# Patient Record
Sex: Male | Born: 2000 | Race: Black or African American | Hispanic: No | Marital: Single | Smoking: Never smoker
Health system: Southern US, Community
[De-identification: ages and names within clinical notes are randomized; demographics above are authoritative.]

## PROBLEM LIST (undated history)

## (undated) HISTORY — PX: KIDNEY SURGERY: SHX687

---
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
## 2022-01-12 ENCOUNTER — Ambulatory Visit: Payer: Commercial Managed Care - PPO | Attending: Family | Admitting: Physical Therapy

## 2022-01-12 ENCOUNTER — Encounter: Payer: Self-pay | Admitting: Physical Therapy

## 2022-01-12 DIAGNOSIS — S86811A Strain of other muscle(s) and tendon(s) at lower leg level, right leg, initial encounter: Secondary | ICD-10-CM | POA: Diagnosis not present

## 2022-01-12 DIAGNOSIS — M25561 Pain in right knee: Secondary | ICD-10-CM

## 2022-01-12 NOTE — Therapy (Signed)
?OUTPATIENT PHYSICAL THERAPY LOWER EXTREMITY EVALUATION ? ? ?Patient Name: Bradley Lane ?MRN: [id number] ?DOB:2001-07-29, 21 y.o., male ?Today's Date: 01/13/2022 ? ? PT End of Session - 01/12/22 1027   ? ? Visit Number 1   ? Number of Visits 12   ? Date for PT Re-Evaluation 03/09/22   ? Authorization Type UHC/ UMR, MCD secondary   ? PT Start Time 1022   ? PT Stop Time 1104   ? PT Time Calculation (min) 42 min   ? Activity Tolerance Patient tolerated treatment well   ? Behavior During Therapy [facility] for tasks assessed/performed   ? ?  ?  ? ?  ? ? ?History reviewed. No pertinent past medical history. ?Past Surgical History:  ?Procedure Laterality Date  ? KIDNEY SURGERY    ? thinks there was a blockage  ? ?Patient Active Problem List  ? Diagnosis Date Noted  ? Rupture of right patellar tendon 12/28/2021  ? Preventative health care 12/03/2021  ? Acute pain of right knee 12/03/2021  ? ? ?PCP: Eden Emms, NP ? ?REFERRING PROVIDER: Mort Sawyers, FNP ? ?REFERRING DIAG: Y86.578I (ICD-10-CM) - Rupture of right patellar tendon, initial encounter  ? ?THERAPY DIAG:  ?Rt knee pain  ? ?ONSET DATE: 11/2021 ? ?SUBJECTIVE:  ? ?SUBJECTIVE STATEMENT: ?Patient was playing basketball last month.  Collided with another player. Knee was swollen for a bit.  Saw MD in 2 weeks.  He has difficulty playing basketball but continues to do so.  MD recommended to wear a sleeve but he admits he does not. He has pain with running, up stairs and if he sits too long the pain increases.  He often has to kick his leg out to the side in class when there is no leg room. He denies weakness or sensory issues. Symptoms are not getting better, feels the same.  ? ?PERTINENT HISTORY: ?Right knee: states that he sprained it twice in the past year. Recently sprained it an additional 2 times.  ?PAIN:  ?Are you having pain?Pain 0/10 on eval, with movement  Yes: NPRS scale: 4/10 ?Pain location: Rt knee inferior to patella  ?Pain description: stinging ?Aggravating  factors: sitting, stairs, running  ?Relieving factors: stretching it out  ? ?PRECAUTIONS: Other: none  ? ?WEIGHT BEARING RESTRICTIONS No ? ?FALLS:  ?Has patient fallen in last 6 months? No ? ?LIVING ENVIRONMENT: ?Lives with: lives with their family ?Lives in: House/apartment ?Stairs: Yes: External: 3 steps; none ?Has following equipment at home: None ? ?OCCUPATION: Consulting civil engineer, Engineering in [place] ? ?PLOF: Independent ? ?PATIENT GOALS Pt would like to get rid of most of the pain.  ? ? ?OBJECTIVE:  ? ?DIAGNOSTIC FINDINGS:  ?MRI ?Small, partial width, high-grade insertional tear involving the ?lateral aspect of the patellar tendon at the proximal attachment on ?the patella. Low-grade tendinosis or interstitial tearing medially. ?  ?No evidence of meniscus tear. Intact cruciate and collateral ?ligaments. ?  ?Low-grade chondral fissuring along the medial patellar facet. ?  ? ?PATIENT SURVEYS:  ?LEFS T ?  FOTO score 56% ?COGNITION: ? Overall cognitive status: Within functional limits for tasks assessed   ?  ?SENSATION: ?WFL ? ?MUSCLE LENGTH: ?Hamstrings: Right 40 deg; Left 50 deg ?Thomas test: Right tight deg; Left tight deg ? ?POSTURE:  ?Stands , sits with hips ER and abducted.  Genu varus , slight  ? ?PALPATION: ?Min tenderness just inferior to patella  ? ?LE ROM: ? ?Active ROM Right ?01/13/2022 Left ?01/13/2022  ?Hip flexion    ?  Hip extension    ?Hip abduction    ?Hip adduction    ?Hip internal rotation    ?Hip external rotation    ?Knee flexion 135 140  ?Knee extension 4 0  ?Ankle dorsiflexion    ?Ankle plantarflexion    ?Ankle inversion    ?Ankle eversion    ? (Blank rows = not tested) ?8 degree quad lag  ?LE MMT: ? ?MMT Right ?01/13/2022 Left ?01/13/2022  ?Hip flexion 5 5  ?Hip extension 4 5  ?Hip abduction 4- 4  ?Hip adduction    ?Hip internal rotation    ?Hip external rotation    ?Knee flexion 5 5  ?Knee extension 5 5  ?Ankle dorsiflexion    ?Ankle plantarflexion    ?Ankle inversion    ?Ankle eversion    ?  (Blank rows = not tested) ? ? ?FUNCTIONAL TESTS:  ?5 times sit to stand: WNL  ?Step ups WNL ?Step downs painful , shaky  ?Squats min pain with correction for hips and LE alignment  ? ?GAIT: ?Distance walked: 150 ?Assistive device utilized: None ?Level of assistance: Complete Independence ?Comments: WFL no limp , good stride  ? ? ? ?TODAY'S TREATMENT: ?PT eval and HEP  ? ? ?PATIENT EDUCATION:  ?Education details: HEP, POC ,anatomy  ?Person educated: Patient ?Education method: Explanation, Demonstration, Verbal cues, and Handouts ?Education comprehension: verbalized understanding, returned demonstration, and needs further education ? ? ?HOME EXERCISE PROGRAM: ?Access Code: [email or web address] ?URL: [email or web address] ?Date: 01/12/2022 ?Prepared by: Karie MainlandJennifer Gilman Olazabal ? ?Exercises ?- Straight Leg Raise with External Rotation  - 2 x daily - 7 x weekly - 2 sets - 10 reps - 5 hold ?- Prone Quadriceps Stretch with Strap  - 1 x daily - 7 x weekly - 1 sets - 3 reps - 30 hold ?- Modified Thomas Stretch  - 1 x daily - 7 x weekly - 1 sets - 3 reps - 30 hold ? ?ASSESSMENT: ? ?CLINICAL IMPRESSION: ?Patient is a 21  y.o. male  who was seen today for physical therapy evaluation and treatment for Rt patellar injury, small tear , see above imaging. Asked him to wear sleeve for basketball as a reminder to him he should protect his knee.   ? ? ?OBJECTIVE IMPAIRMENTS decreased mobility, difficulty walking, decreased strength, impaired flexibility, and pain.  ? ?ACTIVITY LIMITATIONS community activity and recreation .  ? ?PERSONAL FACTORS Behavior pattern and 1 comorbidity: previous injuries to L kne  are also affecting patient's functional outcome.  ? ? ?REHAB POTENTIAL: Excellent ? ?CLINICAL DECISION MAKING: Stable/uncomplicated ? ?EVALUATION COMPLEXITY: Low ? ? ?GOALS: ?Goals reviewed with patient? Yes ? ?LONG TERM GOALS: Target date: 02/24/2022 ? ?Pt will be I with HEP for hips, Rt knee strength, stability  ?Baseline: unknown ?Goal  status: INITIAL ? ?2.  Pt will increase hip strength to 5/5 for maximal knee stability with basketball   ?Baseline: 4 to 4-/5 ?Goal status: INITIAL ? ?3.  Pt will be able to regularly go up and down stairs without knee pain  ?Baseline: pain most of the time, esp. ascending ?Goal status: INITIAL ? ?4.  Pt will be able to run, perform light agility activities without increased knee pain  ?Baseline: pain, 4/10-5/10 with these  ?Goal status: INITIAL ? ?5.  Pt will improve FOTO score to 80% or better  ?Baseline: 56% ?Goal status: INITIAL ? ?PLAN: ?PT FREQUENCY: 2x/week ? ?PT DURATION: 6 weeks ? ?PLANNED INTERVENTIONS: Therapeutic exercises, Therapeutic activity, Neuromuscular re-education, Balance training, Gait training, Patient/Family  education, Joint mobilization, Cryotherapy, Taping, Ionotophoresis 4mg /ml Dexamethasone, and Manual therapy ? ?PLAN FOR NEXT SESSION: check HEP, add on for hip strength (abd, lateral band walk, squat with band, lat step ups) ? ? ?Seaborn Nakama, PT ?01/13/2022, 7:54 AM  ?01/15/2022, PT ?01/13/22 7:54 AM ?Phone: [phone] ?Fax: [phone]  ?

## 2022-01-15 ENCOUNTER — Encounter: Payer: Self-pay | Admitting: Physical Therapy

## 2022-01-15 ENCOUNTER — Ambulatory Visit: Payer: Commercial Managed Care - PPO | Admitting: Physical Therapy

## 2022-01-15 DIAGNOSIS — M25561 Pain in right knee: Secondary | ICD-10-CM | POA: Diagnosis not present

## 2022-01-15 NOTE — Therapy (Signed)
?OUTPATIENT PHYSICAL THERAPY TREATMENT NOTE ? ? ?Patient Name: Bradley Lane ?MRN: [id number] ?DOB:2000-11-05, 21 y.o., male ?Today's Date: 01/15/2022 ? ? ?END OF SESSION:  ? PT End of Session - 01/15/22 1194   ? ? Visit Number 2   ? Number of Visits 12   ? Date for PT Re-Evaluation 03/09/22   ? Authorization Type UHC/ UMR, MCD secondary   ? PT Start Time 0715   ? PT Stop Time 0753   ? PT Time Calculation (min) 38 min   ? ?  ?  ? ?  ? ? ?History reviewed. No pertinent past medical history. ?Past Surgical History:  ?Procedure Laterality Date  ? KIDNEY SURGERY    ? thinks there was a blockage  ? ?Patient Active Problem List  ? Diagnosis Date Noted  ? Rupture of right patellar tendon 12/28/2021  ? Preventative health care 12/03/2021  ? Acute pain of right knee 12/03/2021  ?PCP: Eden Emms, NP ?  ?REFERRING PROVIDER: Mort Sawyers, FNP ?  ?REFERRING DIAG: R74.081K (ICD-10-CM) - Rupture of right patellar tendon, initial encounter  ? ?THERAPY DIAG:  ?Acute pain of right knee ? ?PERTINENT HISTORY: Right knee: states that he sprained it twice in the past year. Recently sprained it an additional 2 times.   ? ?PRECAUTIONS: Other: none   ? ?SUBJECTIVE: The knee is the same. No pain on arrival.  ? ?PAIN:  ?Are you having pain?Pain 0/10 on eval, with movement  Yes: NPRS scale: 4/10 ?Pain location: Rt knee inferior to patella  ?Pain description: stinging ?Aggravating factors: sitting, stairs, running  ?Relieving factors: stretching it out  ? ? ?OBJECTIVE: (objective measures completed at initial evaluation unless otherwise dated) ? ? ?OBJECTIVE:  ?  ?DIAGNOSTIC FINDINGS:  ?MRI ?Small, partial width, high-grade insertional tear involving the ?lateral aspect of the patellar tendon at the proximal attachment on ?the patella. Low-grade tendinosis or interstitial tearing medially. ?  ?No evidence of meniscus tear. Intact cruciate and collateral ?ligaments. ?  ?Low-grade chondral fissuring along the medial patellar facet. ?  ?   ?PATIENT SURVEYS:  ?LEFS T ?            FOTO score 56% ?COGNITION: ?          Overall cognitive status: Within functional limits for tasks assessed               ?           ?SENSATION: ?WFL ?  ?MUSCLE LENGTH: ?Hamstrings: Right 40 deg; Left 50 deg ?Thomas test: Right tight deg; Left tight deg ?  ?POSTURE:  ?Stands , sits with hips ER and abducted.  Genu varus , slight  ?  ?PALPATION: ?Min tenderness just inferior to patella  ?  ?LE ROM: ?  ?Active ROM Right ?01/13/2022 Left ?01/13/2022  ?Hip flexion      ?Hip extension      ?Hip abduction      ?Hip adduction      ?Hip internal rotation      ?Hip external rotation      ?Knee flexion 135 140  ?Knee extension 4 0  ?Ankle dorsiflexion      ?Ankle plantarflexion      ?Ankle inversion      ?Ankle eversion      ? (Blank rows = not tested) ?8 degree quad lag  ?LE MMT: ?  ?MMT Right ?01/13/2022 Left ?01/13/2022  ?Hip flexion 5 5  ?Hip extension 4 5  ?Hip abduction 4- 4  ?  Hip adduction      ?Hip internal rotation      ?Hip external rotation      ?Knee flexion 5 5  ?Knee extension 5 5  ?Ankle dorsiflexion      ?Ankle plantarflexion      ?Ankle inversion      ?Ankle eversion      ? (Blank rows = not tested) ?  ?  ?FUNCTIONAL TESTS:  ?5 times sit to stand: WNL  ?Step ups WNL ?Step downs painful , shaky  ?Squats min pain with correction for hips and LE alignment  ?  ?GAIT: ?Distance walked: 150 ?Assistive device utilized: None ?Level of assistance: Complete Independence ?Comments: WFL no limp , good stride  ?  ?  ?  ?TODAY'S TREATMENT: ?[facility] Adult PT Treatment:                                                DATE: 01/15/22 ?Therapeutic Exercise: ?Rec bike L2 x 5 min ?Hip flexor stretch 3 x 30 ?Prone quad stretch 3 x 30 sec ?SLR with ER 10 x 2  ?Side hip abduction 10 x 2  ?Sit-stand with blue band 10 x 1, squat to chair tap  10 x 2  ?Lateral band walks with blue band 10 ft x 4  ?4 inch lateral step ups x 10- cues for LE joint alignment -increased pain - decreased motor control   ? ? ?INITIAL TREATMENT: ?PT eval and HEP  ?  ?  ?PATIENT EDUCATION:  ?Education details: HEP, POC ,anatomy  ?Person educated: Patient ?Education method: Explanation, Demonstration, Verbal cues, and Handouts ?Education comprehension: verbalized understanding, returned demonstration, and needs further education ?  ?  ?HOME EXERCISE PROGRAM: ?Access Code: [email or web address] ?URL: [email or web address] ?Date: 01/15/2022 ?Prepared by: Jannette Spanner ? ?Exercises ?- Straight Leg Raise with External Rotation  - 2 x daily - 7 x weekly - 2 sets - 10 reps - 5 hold ?- Prone Quadriceps Stretch with Strap  - 1 x daily - 7 x weekly - 1 sets - 3 reps - 30 hold ?- Modified Thomas Stretch  - 1 x daily - 7 x weekly - 1 sets - 3 reps - 30 hold ?- Sidelying Hip Abduction  - 1 x daily - 7 x weekly - 2 sets - 10 reps ?- Squat with Chair Touch and Resistance Loop  - 1 x daily - 7 x weekly - 3 sets - 10 reps ?- Side Stepping with Resistance at Thighs  - 1 x daily - 7 x weekly - 4 sets - 10 reps ?  ?ASSESSMENT: ?  ?CLINICAL IMPRESSION: ?Patient is a 21  y.o. male  who was seen today for physical therapy treatment for Rt patellar injury, small tear , see above imaging. He reports no change since evaluation and has been compliant with initial HEP. Today, time spent with review of initial HEP and progression per PT POC with updated HEP.  He was able to complete banded squats and side steps without increased pain. Lateral step ups were painful.  ?  ?OBJECTIVE IMPAIRMENTS decreased mobility, difficulty walking, decreased strength, impaired flexibility, and pain.  ?  ?ACTIVITY LIMITATIONS community activity and recreation .  ?  ?PERSONAL FACTORS Behavior pattern and 1 comorbidity: previous injuries to L kne  are also affecting patient's functional outcome.  ?  ?  ?REHAB POTENTIAL:  Excellent ?  ?CLINICAL DECISION MAKING: Stable/uncomplicated ?  ?EVALUATION COMPLEXITY: Low ?  ?  ?GOALS: ?Goals reviewed with patient? Yes ?  ?LONG TERM GOALS:  Target date: 02/24/2022 ?  ?Pt will be I with HEP for hips, Rt knee strength, stability  ?Baseline: unknown ?Goal status: INITIAL ?  ?2.  Pt will increase hip strength to 5/5 for maximal knee stability with basketball   ?Baseline: 4 to 4-/5 ?Goal status: INITIAL ?  ?3.  Pt will be able to regularly go up and down stairs without knee pain  ?Baseline: pain most of the time, esp. ascending ?Goal status: INITIAL ?  ?4.  Pt will be able to run, perform light agility activities without increased knee pain  ?Baseline: pain, 4/10-5/10 with these  ?Goal status: INITIAL ?  ?5.  Pt will improve FOTO score to 80% or better  ?Baseline: 56% ?Goal status: INITIAL ?  ?PLAN: ?PT FREQUENCY: 2x/week ?  ?PT DURATION: 6 weeks ?  ?PLANNED INTERVENTIONS: Therapeutic exercises, Therapeutic activity, Neuromuscular re-education, Balance training, Gait training, Patient/Family education, Joint mobilization, Cryotherapy, Taping, Ionotophoresis 4mg /ml Dexamethasone, and Manual therapy ?  ?PLAN FOR NEXT SESSION: check HEP, add on for hip strength - lat step ups when able , consider ionto next visit.  ? ?Check all possible CPT codes: [id number]- Therapeutic Exercise, [id number]- Neuro Re-education, [phone] - Gait Training, [phone] - Manual Therapy, 97530 - Therapeutic Activities, [phone] - Self Care, and [phone] - Iontophoresis    ? ?If treatment provided at initial evaluation, no treatment charged due to lack of authorization.   ? ? ? ? ?Jannette SpannerJessica Arris Meyn, PTA ?01/15/22 8:14 AM ?Phone: [phone] ?Fax: [phone]  ? ?   ?

## 2022-01-19 ENCOUNTER — Ambulatory Visit: Payer: Commercial Managed Care - PPO | Attending: Family

## 2022-01-19 DIAGNOSIS — M25561 Pain in right knee: Secondary | ICD-10-CM | POA: Diagnosis present

## 2022-01-19 NOTE — Therapy (Signed)
?OUTPATIENT PHYSICAL THERAPY TREATMENT NOTE ? ? ?Patient Name: Bradley Lane ?MRN: [id number] ?DOB:01/27/2001, 21 y.o., male ?Today's Date: 01/19/2022 ? ? ?END OF SESSION:  ? PT End of Session - 01/19/22 2150   ? ? Visit Number 3   ? Number of Visits 12   ? Date for PT Re-Evaluation 03/09/22   ? Authorization Type UHC/ UMR, MCD secondary   ? PT Start Time [phone]   ? PT Stop Time 0800   ? PT Time Calculation (min) 42 min   ? Activity Tolerance Patient tolerated treatment well   ? [facility] for tasks assessed/performed   ? ?  ?  ? ?  ? ? ? ?History reviewed. No pertinent past medical history. ?Past Surgical History:  ?Procedure Laterality Date  ? KIDNEY SURGERY    ? thinks there was a blockage  ? ?Patient Active Problem List  ? Diagnosis Date Noted  ? Rupture of right patellar tendon 12/28/2021  ? Preventative health care 12/03/2021  ? Acute pain of right knee 12/03/2021  ?PCP: Eden Emmsable, James M, NP ?  ?REFERRING PROVIDER: Mort Sawyersugal, Tabitha, FNP ?  ?REFERRING DIAG: [id number] (ICD-10-CM) - Rupture of right patellar tendon, initial encounter  ? ?THERAPY DIAG:  ?Acute pain of right knee ? ?PERTINENT HISTORY: Right knee: states that he sprained it twice in the past year. Recently sprained it an additional 2 times.   ? ?PRECAUTIONS: Other: none   ? ?SUBJECTIVE: Pt reports no change with his R knee pain. Pt notes he has been completing his HEP. ? ?PAIN:  ?Are you having pain?Pain 0/10 on eval, with movement  Yes: NPRS scale: 4/10 ?Pain location: Rt knee inferior to patella  ?Pain description: stinging ?Aggravating factors: sitting, stairs, running  ?Relieving factors: stretching it out  ? ? ?OBJECTIVE: (objective measures completed at initial evaluation unless otherwise dated) ? ? ?OBJECTIVE:  ?  ?DIAGNOSTIC FINDINGS:  ?MRI ?Small, partial width, high-grade insertional tear involving the ?lateral aspect of the patellar tendon at the proximal attachment on ?the patella. Low-grade tendinosis or interstitial tearing  medially. ?  ?No evidence of meniscus tear. Intact cruciate and collateral ?ligaments. ?  ?Low-grade chondral fissuring along the medial patellar facet. ?  ?  ?PATIENT SURVEYS:  ?LEFS T ?            FOTO score 56% ?COGNITION: ?          Overall cognitive status: Within functional limits for tasks assessed               ?           ?SENSATION: ?WFL ?  ?MUSCLE LENGTH: ?Hamstrings: Right 40 deg; Left 50 deg ?Thomas test: Right tight deg; Left tight deg ?  ?POSTURE:  ?Stands , sits with hips ER and abducted.  Genu varus , slight  ?  ?PALPATION: ?Min tenderness just inferior to patella  ?  ?LE ROM: ?  ?Active ROM Right ?01/13/2022 Left ?01/13/2022  ?Hip flexion      ?Hip extension      ?Hip abduction      ?Hip adduction      ?Hip internal rotation      ?Hip external rotation      ?Knee flexion 135 140  ?Knee extension 4 0  ?Ankle dorsiflexion      ?Ankle plantarflexion      ?Ankle inversion      ?Ankle eversion      ? (Blank rows = not tested) ?8 degree quad  lag  ?LE MMT: ?  ?MMT Right ?01/13/2022 Left ?01/13/2022  ?Hip flexion 5 5  ?Hip extension 4 5  ?Hip abduction 4- 4  ?Hip adduction      ?Hip internal rotation      ?Hip external rotation      ?Knee flexion 5 5  ?Knee extension 5 5  ?Ankle dorsiflexion      ?Ankle plantarflexion      ?Ankle inversion      ?Ankle eversion      ? (Blank rows = not tested) ?  ?  ?FUNCTIONAL TESTS:  ?5 times sit to stand: WNL  ?Step ups WNL ?Step downs painful , shaky  ?Squats min pain with correction for hips and LE alignment  ?  ?GAIT: ?Distance walked: 150 ?Assistive device utilized: None ?Level of assistance: Complete Independence ?Comments: WFL no limp , good stride  ?  ?  ?  ?TODAY'S TREATMENT: ?[facility] Adult PT Treatment:                                                DATE: 01/19/22 ?Therapeutic Exercise: ?Rec bike L2 x 5 min ?Hip flexor stretch 1 x 60 ?Prone quad stretch 1 x 60 sec ?SLR with ER 10 x 3, 4#  ?Side hip abduction 10 x 3, 4#  ?LAQ reclined trunk 10 x2 4#, min pain ?Sit-stand  with blue band 10 x 1, squat to chair tap  10 x 2, min pain  ?Lateral band walks with blue band 10 ft x 6 ?Lateral step ups c airex 2 x 10- cues for LE joint alignment ?Manual Therapy: ?CFM to the patellar tendon 3 min ?Self Care: ?CFM at home 2 to 3 x a day  ? ?[facility] Adult PT Treatment:                                                DATE: 01/15/22 ?Therapeutic Exercise: ?Rec bike L2 x 5 min ?Hip flexor stretch 3 x 30 ?Prone quad stretch 3 x 30 sec ?SLR with ER 10 x 2  ?Side hip abduction 10 x 2  ?Sit-stand with blue band 10 x 1, squat to chair tap  10 x 2  ?Lateral band walks with blue band 10 ft x 4  ?4 inch lateral step ups x 10- cues for LE joint alignment -increased pain - decreased motor control   ?  ?PATIENT EDUCATION:  ?Education details: HEP, POC ,anatomy  ?Person educated: Patient ?Education method: Explanation, Demonstration, Verbal cues, and Handouts ?Education comprehension: verbalized understanding, returned demonstration, and needs further education ?  ?  ?HOME EXERCISE PROGRAM: ?Access Code: [email or web address] ?URL: [email or web address] ?Date: 01/15/2022 ?Prepared by: Jannette Spanner ? ?Exercises ?- Straight Leg Raise with External Rotation  - 2 x daily - 7 x weekly - 2 sets - 10 reps - 5 hold ?- Prone Quadriceps Stretch with Strap  - 1 x daily - 7 x weekly - 1 sets - 3 reps - 30 hold ?- Modified Thomas Stretch  - 1 x daily - 7 x weekly - 1 sets - 3 reps - 30 hold ?- Sidelying Hip Abduction  - 1 x daily - 7 x weekly - 2 sets - 10 reps ?- Squat  with Chair Touch and Resistance Loop  - 1 x daily - 7 x weekly - 3 sets - 10 reps ?- Side Stepping with Resistance at Thighs  - 1 x daily - 7 x weekly - 4 sets - 10 reps ?  ?ASSESSMENT: ?  ?CLINICAL IMPRESSION: ?Pt was instructed in CFM for tissue remodeling. PT was competed for R LE strengthening in open and closed kinetic chain. Pt preorted experiencing min R knee pain with a few of the thererex, but n overall increase. Pt tolerated a gradual increase in  the physical demand with therex.  ?  ?OBJECTIVE IMPAIRMENTS decreased mobility, difficulty walking, decreased strength, impaired flexibility, and pain.  ?  ?ACTIVITY LIMITATIONS community activity and recreation .  ?  ?PERSONAL FACTORS Behavior pattern and 1 comorbidity: previous injuries to L kne  are also affecting patient's functional outcome.  ?  ?  ?REHAB POTENTIAL: Excellent ?  ?CLINICAL DECISION MAKING: Stable/uncomplicated ?  ?EVALUATION COMPLEXITY: Low ?  ?  ?GOALS: ?Goals reviewed with patient? Yes ?  ?LONG TERM GOALS: Target date: 02/24/2022 ?  ?Pt will be I with HEP for hips, Rt knee strength, stability  ?Baseline: unknown ?Goal status: INITIAL ?  ?2.  Pt will increase hip strength to 5/5 for maximal knee stability with basketball   ?Baseline: 4 to 4-/5 ?Goal status: INITIAL ?  ?3.  Pt will be able to regularly go up and down stairs without knee pain  ?Baseline: pain most of the time, esp. ascending ?Goal status: INITIAL ?  ?4.  Pt will be able to run, perform light agility activities without increased knee pain  ?Baseline: pain, 4/10-5/10 with these  ?Goal status: INITIAL ?  ?5.  Pt will improve FOTO score to 80% or better  ?Baseline: 56% ?Goal status: INITIAL ?  ?PLAN: ?PT FREQUENCY: 2x/week ?  ?PT DURATION: 6 weeks ?  ?PLANNED INTERVENTIONS: Therapeutic exercises, Therapeutic activity, Neuromuscular re-education, Balance training, Gait training, Patient/Family education, Joint mobilization, Cryotherapy, Taping, Ionotophoresis 4mg /ml Dexamethasone, and Manual therapy ?  ?PLAN FOR NEXT SESSION: check HEP, add on for hip strength - lat step ups when able , consider ionto next visit.  ? ?Check all possible CPT codes: - Therapeutic Exercise, [phone]- Neuro Re-education, [phone] - Gait Training, [phone] - Manual Therapy, 97530 - Therapeutic Activities, [phone] - Self Care, and [phone] - Iontophoresis    ? ?If treatment provided at initial evaluation, no treatment charged due to lack of authorization.   ? ?[id number] MS, PT ?01/19/22 10:02 PM ? ? ?   ?

## 2022-01-22 ENCOUNTER — Encounter: Payer: Self-pay | Admitting: Physical Therapy

## 2022-01-22 ENCOUNTER — Ambulatory Visit: Payer: Commercial Managed Care - PPO | Admitting: Physical Therapy

## 2022-01-22 DIAGNOSIS — M25561 Pain in right knee: Secondary | ICD-10-CM

## 2022-01-22 NOTE — Therapy (Signed)
?OUTPATIENT PHYSICAL THERAPY TREATMENT NOTE ? ? ?Patient Name: Bradley Lane ?MRN: [id number] ?DOB:[birth year], 21 y.o., male ?Today's Date: 01/22/2022 ? ? ?END OF SESSION:  ? PT End of Session - 01/22/22 [id number]   ? ? Visit Number 4   ? Number of Visits 12   ? Date for PT Re-Evaluation 03/09/22   ? Authorization Type UHC/ UMR, MCD secondary   ? PT Start Time 0715   ? PT Stop Time 0753   ? PT Time Calculation (min) 38 min   ? ?  ?  ? ?  ? ? ? ?History reviewed. No pertinent past medical history. ?Past Surgical History:  ?Procedure Laterality Date  ? KIDNEY SURGERY    ? thinks there was a blockage  ? ?Patient Active Problem List  ? Diagnosis Date Noted  ? Rupture of right patellar tendon 12/28/2021  ? Preventative health care 12/03/2021  ? Acute pain of right knee 12/03/2021  ?PCP: Michela Pitcher, NP ?  ?REFERRING PROVIDER: Eugenia Pancoast, FNP ?  ?REFERRING DIAG: [id number] (ICD-10-CM) - Rupture of right patellar tendon, initial encounter  ? ?THERAPY DIAG: Acute pain of right knee ? ? ?PERTINENT HISTORY: Right knee: states that he sprained it twice in the past year. Recently sprained it an additional 2 times.   ? ?PRECAUTIONS: Other: none   ? ?SUBJECTIVE: Pt reports knee pain is better.  Pt notes he has been completing his HEP. ? ?PAIN:  ?Are you having pain?Pain 0/10 on eval, with movement  Yes: NPRS scale: 4/10 ?Pain location: Rt knee inferior to patella  ?Pain description: stinging ?Aggravating factors: sitting, stairs, running  ?Relieving factors: stretching it out  ? ? ?OBJECTIVE: (objective measures completed at initial evaluation unless otherwise dated) ? ? ?OBJECTIVE:  ?  ?DIAGNOSTIC FINDINGS:  ?MRI ?Small, partial width, high-grade insertional tear involving the ?lateral aspect of the patellar tendon at the proximal attachment on ?the patella. Low-grade tendinosis or interstitial tearing medially. ?  ?No evidence of meniscus tear. Intact cruciate and collateral ?ligaments. ?  ?Low-grade chondral fissuring along the  medial patellar facet. ?  ?  ?PATIENT SURVEYS:  ?LEFS T ?            FOTO score 56% ?COGNITION: ?          Overall cognitive status: Within functional limits for tasks assessed               ?           ?SENSATION: ?WFL ?  ?MUSCLE LENGTH: ?Hamstrings: Right 40 deg; Left 50 deg ?Thomas test: Right tight deg; Left tight deg ?  ?POSTURE:  ?Stands , sits with hips ER and abducted.  Genu varus , slight  ?  ?PALPATION: ?Min tenderness just inferior to patella  ?  ?LE ROM: ?  ?Active ROM Right ?01/13/2022 Left ?01/13/2022  ?Hip flexion      ?Hip extension      ?Hip abduction      ?Hip adduction      ?Hip internal rotation      ?Hip external rotation      ?Knee flexion 135 140  ?Knee extension 4 0  ?Ankle dorsiflexion      ?Ankle plantarflexion      ?Ankle inversion      ?Ankle eversion      ? (Blank rows = not tested) ?8 degree quad lag  ?LE MMT: ?  ?MMT Right ?01/13/2022 Left ?01/13/2022  ?Hip flexion 5 5  ?Hip extension 4 5  ?  Hip abduction 4- 4  ?Hip adduction      ?Hip internal rotation      ?Hip external rotation      ?Knee flexion 5 5  ?Knee extension 5 5  ?Ankle dorsiflexion      ?Ankle plantarflexion      ?Ankle inversion      ?Ankle eversion      ? (Blank rows = not tested) ?  ?  ?FUNCTIONAL TESTS:  ?5 times sit to stand: WNL  ?Step ups WNL ?Step downs painful , shaky  ?Squats min pain with correction for hips and LE alignment  ?  ?GAIT: ?Distance walked: 150 ?Assistive device utilized: None ?Level of assistance: Complete Independence ?Comments: WFL no limp , good stride  ?  ?  ?  ?TODAY'S TREATMENT: ?[facility] Adult PT Treatment:                                                DATE: 01/22/22 ?Therapeutic Exercise: ?Nustep L5 LE only x 5 min ?4 inch lateral step down 10 x 2  ?6 inch runners step up 10 x 2  ?blue band  squat to chair tap  10 x 2, no pain ?Lateral band walks with blue band 10 ft x 6 ?Side hip abduction 10 x 3, 4#  ?SLR with ER 10 x 3, 4#  ?SAQ 4# 10 x 3 ?LAQ 4# 10 x 3  ?Hip flexor stretch 2 x 60 ?Prone quad  stretch 2 x 60 ?Hamstring stretch with strap 3 x 30 ? ? ? ?[facility] Adult PT Treatment:                                                DATE: 01/19/22 ?Therapeutic Exercise: ?Rec bike L2 x 5 min ?Hip flexor stretch 1 x 60 ?Prone quad stretch 1 x 60 sec ?SLR with ER 10 x 3, 4#  ?Side hip abduction 10 x 3, 4#  ?LAQ reclined trunk 10 x2 4#, min pain ?Sit-stand with blue band 10 x 1, squat to chair tap  10 x 2, min pain  ?Lateral band walks with blue band 10 ft x 6 ?Lateral step ups c airex 2 x 10- cues for LE joint alignment ?Manual Therapy: ?CFM to the patellar tendon 3 min ?Self Care: ?CFM at home 2 to 3 x a day  ? ?[facility] Adult PT Treatment:                                                DATE: 01/15/22 ?Therapeutic Exercise: ?Rec bike L2 x 5 min ?Hip flexor stretch 3 x 30 ?Prone quad stretch 3 x 30 sec ?SLR with ER 10 x 2  ?Side hip abduction 10 x 2  ?Sit-stand with blue band 10 x 1, squat to chair tap  10 x 2  ?Lateral band walks with blue band 10 ft x 4  ?4 inch lateral step ups x 10- cues for LE joint alignment -increased pain - decreased motor control   ?  ?PATIENT EDUCATION:  ?Education details: HEP, POC ,anatomy  ?Person educated: Patient ?  Education method: Explanation, Demonstration, Verbal cues, and Handouts ?Education comprehension: verbalized understanding, returned demonstration, and needs further education ?  ?  ?HOME EXERCISE PROGRAM: ?Access Code: [email or web address] ?URL: [email or web address] ?Date: 01/15/2022 ?Prepared by: Hessie Diener ? ?Exercises ?- Straight Leg Raise with External Rotation  - 2 x daily - 7 x weekly - 2 sets - 10 reps - 5 hold ?- Prone Quadriceps Stretch with Strap  - 1 x daily - 7 x weekly - 1 sets - 3 reps - 30 hold ?- Modified Thomas Stretch  - 1 x daily - 7 x weekly - 1 sets - 3 reps - 30 hold ?- Sidelying Hip Abduction  - 1 x daily - 7 x weekly - 2 sets - 10 reps ?- Squat with Chair Touch and Resistance Loop  - 1 x daily - 7 x weekly - 3 sets - 10 reps ?- Side Stepping with  Resistance at Thighs  - 1 x daily - 7 x weekly - 4 sets - 10 reps ?  ?ASSESSMENT: ?  ?CLINICAL IMPRESSION: ? PT was competed for R LE strengthening in open and closed kinetic chain. Pt reported min pain with lateral step ups otherwise, no pain.   Pt tolerated a gradual increase in the physical demand with therex. Overall pt is reporting a decrease in right knee pain and less pain with therex.  ?  ?OBJECTIVE IMPAIRMENTS decreased mobility, difficulty walking, decreased strength, impaired flexibility, and pain.  ?  ?ACTIVITY LIMITATIONS community activity and recreation .  ?  ?PERSONAL FACTORS Behavior pattern and 1 comorbidity: previous injuries to L kne  are also affecting patient's functional outcome.  ?  ?  ?REHAB POTENTIAL: Excellent ?  ?CLINICAL DECISION MAKING: Stable/uncomplicated ?  ?EVALUATION COMPLEXITY: Low ?  ?  ?GOALS: ?Goals reviewed with patient? Yes ?  ?LONG TERM GOALS: Target date: 02/24/2022 ?  ?Pt will be I with HEP for hips, Rt knee strength, stability  ?Baseline: unknown ?Goal status: INITIAL ?  ?2.  Pt will increase hip strength to 5/5 for maximal knee stability with basketball   ?Baseline: 4 to 4-/5 ?Goal status: INITIAL ?  ?3.  Pt will be able to regularly go up and down stairs without knee pain  ?Baseline: pain most of the time, esp. ascending ?Goal status: INITIAL ?  ?4.  Pt will be able to run, perform light agility activities without increased knee pain  ?Baseline: pain, 4/10-5/10 with these  ?Goal status: INITIAL ?  ?5.  Pt will improve FOTO score to 80% or better  ?Baseline: 56% ?Goal status: INITIAL ?  ?PLAN: ?PT FREQUENCY: 2x/week ?  ?PT DURATION: 6 weeks ?  ?PLANNED INTERVENTIONS: Therapeutic exercises, Therapeutic activity, Neuromuscular re-education, Balance training, Gait training, Patient/Family education, Joint mobilization, Cryotherapy, Taping, Ionotophoresis 4mg /ml Dexamethasone, and Manual therapy ?  ?PLAN FOR NEXT SESSION: check HEP, add on for hip strength - lat step ups when  able , consider ionto next visit.  ? ?Check all possible CPT codes: 97110- Therapeutic Exercise, [phone]- Neuro Re-education, [phone] - Gait Training, [phone] - Manual Therapy, 97530 - Therapeutic Activities, [phone] -

## 2022-01-26 ENCOUNTER — Ambulatory Visit: Payer: Commercial Managed Care - PPO | Admitting: Physical Therapy

## 2022-01-26 ENCOUNTER — Encounter: Payer: Self-pay | Admitting: Physical Therapy

## 2022-01-26 DIAGNOSIS — M25561 Pain in right knee: Secondary | ICD-10-CM

## 2022-01-26 NOTE — Therapy (Signed)
?OUTPATIENT PHYSICAL THERAPY TREATMENT NOTE ? ? ?Patient Name: Bradley Lane ?MRN: [id number] ?DOB:06/13/2001, 21 y.o., male ?Today's Date: 01/26/2022 ? ? ?END OF SESSION:  ? PT End of Session - 01/26/22 0717   ? ? Visit Number 5   ? Number of Visits 12   ? Date for PT Re-Evaluation 03/09/22   ? Authorization Type UHC/ UMR, MCD secondary   ? PT Start Time 0715   ? PT Stop Time 0756   ? PT Time Calculation (min) 41 min   ? ?  ?  ? ?  ? ? ? ?History reviewed. No pertinent past medical history. ?Past Surgical History:  ?Procedure Laterality Date  ? KIDNEY SURGERY    ? thinks there was a blockage  ? ?Patient Active Problem List  ? Diagnosis Date Noted  ? Rupture of right patellar tendon 12/28/2021  ? Preventative health care 12/03/2021  ? Acute pain of right knee 12/03/2021  ?PCP: Eden Emms, NP ?  ?REFERRING PROVIDER: Mort Sawyers, FNP ?  ?REFERRING DIAG: N82.956O (ICD-10-CM) - Rupture of right patellar tendon, initial encounter  ? ?THERAPY DIAG: Acute pain of right knee ? ? ?PERTINENT HISTORY: Right knee: states that he sprained it twice in the past year. Recently sprained it an additional 2 times.   ? ?PRECAUTIONS: Other: none   ? ?SUBJECTIVE: Pt reports knee pain is better.  Pt notes he has been completing his HEP. ? ?PAIN:  ?Are you having pain?Pain 0/10 on eval, with movement  Yes: NPRS scale: 4/10 ?Pain location: Rt knee inferior to patella  ?Pain description: stinging ?Aggravating factors: sitting, stairs, running  ?Relieving factors: stretching it out  ? ? ?OBJECTIVE: (objective measures completed at initial evaluation unless otherwise dated) ? ? ?OBJECTIVE:  ?  ?DIAGNOSTIC FINDINGS:  ?MRI ?Small, partial width, high-grade insertional tear involving the ?lateral aspect of the patellar tendon at the proximal attachment on ?the patella. Low-grade tendinosis or interstitial tearing medially. ?  ?No evidence of meniscus tear. Intact cruciate and collateral ?ligaments. ?  ?Low-grade chondral fissuring along the  medial patellar facet. ?  ?  ?PATIENT SURVEYS:  ?LEFS T ?            FOTO score 56% ?COGNITION: ?          Overall cognitive status: Within functional limits for tasks assessed               ?           ?SENSATION: ?WFL ?  ?MUSCLE LENGTH: ?Hamstrings: Right 40 deg; Left 50 deg ?Thomas test: Right tight deg; Left tight deg ?  ?POSTURE:  ?Stands , sits with hips ER and abducted.  Genu varus , slight  ?  ?PALPATION: ?Min tenderness just inferior to patella  ?  ?LE ROM: ?  ?Active ROM Right ?01/13/2022 Left ?01/13/2022 Right  ?01/26/22  ?Hip flexion       ?Hip extension       ?Hip abduction       ?Hip adduction       ?Hip internal rotation       ?Hip external rotation       ?Knee flexion 135 140 140  ?Knee extension 4 0 0  ?Ankle dorsiflexion       ?Ankle plantarflexion       ?Ankle inversion       ?Ankle eversion       ? (Blank rows = not tested) ?8 degree quad lag  ?LE MMT: ?  ?  MMT Right ?01/13/2022 Left ?01/13/2022 Right ?01/26/22  ?Hip flexion 5 5   ?Hip extension 4 5   ?Hip abduction 4- 4 5/5  ?Hip adduction       ?Hip internal rotation       ?Hip external rotation       ?Knee flexion 5 5   ?Knee extension 5 5   ?Ankle dorsiflexion       ?Ankle plantarflexion       ?Ankle inversion       ?Ankle eversion       ? (Blank rows = not tested) ?  ?  ?FUNCTIONAL TESTS:  ?5 times sit to stand: WNL  ?Step ups WNL ?Step downs painful , shaky  ?Squats min pain with correction for hips and LE alignment  ?  ?GAIT: ?Distance walked: 150 ?Assistive device utilized: None ?Level of assistance: Complete Independence ?Comments: WFL no limp , good stride  ?  ?  ?  ?TODAY'S TREATMENT: ?[facility] Adult PT Treatment:                                                DATE: 01/26/22 ?Therapeutic Exercise: ?Nustep L5 LE only x 5 min ?Rebounder toss SLS red ball , added foam oval 20 toss without LOB ?Cone drill from SLS 10 x 2 touches without LOB ?4 inch lateral step down 10 x 2  ?8 inch runners step up 10 x 2  ?blue band  squat to chair tap  10 x 2 ?Lateral  band walks with blue band 10 ft x 6 ?Wall Sit 5 sec x10 x2 ?Side hip abduction 10 x 3, 5#  ?SLR with ER 10 x 3, 5#  ?Single leg bridge R 10 x 2 each ? ?Hip flexor stretch 2 x 60 ?Prone quad stretch 2 x 60 ?Hamstring stretch with strap 3 x 30 ? ? ?[facility] Adult PT Treatment:                                                DATE: 01/22/22 ?Therapeutic Exercise: ?Nustep L5 LE only x 5 min ?4 inch lateral step down 10 x 2  ?6 inch runners step up 10 x 2  ?blue band  squat to chair tap  10 x 2, no pain ?Lateral band walks with blue band 10 ft x 6 ?Side hip abduction 10 x 3, 4#  ?SLR with ER 10 x 3, 4#  ?SAQ 4# 10 x 3 ?LAQ 4# 10 x 3  ?Hip flexor stretch 2 x 60 ?Prone quad stretch 2 x 60 ?Hamstring stretch with strap 3 x 30 ? ? ? ?[facility] Adult PT Treatment:                                                DATE: 01/19/22 ?Therapeutic Exercise: ?Rec bike L2 x 5 min ?Hip flexor stretch 1 x 60 ?Prone quad stretch 1 x 60 sec ?SLR with ER 10 x 3, 4#  ?Side hip abduction 10 x 3, 4#  ?LAQ reclined trunk 10 x2 4#, min pain ?Sit-stand with blue band 10 x  1, squat to chair tap  10 x 2, min pain  ?Lateral band walks with blue band 10 ft x 6 ?Lateral step ups c airex 2 x 10- cues for LE joint alignment ?Manual Therapy: ?CFM to the patellar tendon 3 min ?Self Care: ?CFM at home 2 to 3 x a day  ? ?[facility] Adult PT Treatment:                                                DATE: 01/15/22 ?Therapeutic Exercise: ?Rec bike L2 x 5 min ?Hip flexor stretch 3 x 30 ?Prone quad stretch 3 x 30 sec ?SLR with ER 10 x 2  ?Side hip abduction 10 x 2  ?Sit-stand with blue band 10 x 1, squat to chair tap  10 x 2  ?Lateral band walks with blue band 10 ft x 4  ?4 inch lateral step ups x 10- cues for LE joint alignment -increased pain - decreased motor control   ?  ?PATIENT EDUCATION:  ?Education details: HEP, POC ,anatomy  ?Person educated: Patient ?Education method: Explanation, Demonstration, Verbal cues, and Handouts ?Education comprehension: verbalized understanding,  returned demonstration, and needs further education ?  ?  ?HOME EXERCISE PROGRAM: ?Access Code: [email or web address] ?URL: [email or web address] ?Date: 01/15/2022 ?Prepared by: Jannette Spanner ? ?Exercises ?- Straight Leg Raise with External Rotation  - 2 x daily - 7 x weekly - 2 sets - 10 reps - 5 hold ?- Prone Quadriceps Stretch with Strap  - 1 x daily - 7 x weekly - 1 sets - 3 reps - 30 hold ?- Modified Thomas Stretch  - 1 x daily - 7 x weekly - 1 sets - 3 reps - 30 hold ?- Sidelying Hip Abduction  - 1 x daily - 7 x weekly - 2 sets - 10 reps ?- Squat with Chair Touch and Resistance Loop  - 1 x daily - 7 x weekly - 3 sets - 10 reps ?- Side Stepping with Resistance at Thighs  - 1 x daily - 7 x weekly - 4 sets - 10 reps ?  ?ASSESSMENT: ?  ?CLINICAL IMPRESSION: ? PT was competed for R LE strengthening in open and closed kinetic chain.Increased Single leg challenges today without increased pain. No pain with step downs today. He reports no pain  with ADLs since his last visit.   His MMT and AROM for Right knee and hip have improved. He is progressing toward LTGs.   ?  ?OBJECTIVE IMPAIRMENTS decreased mobility, difficulty walking, decreased strength, impaired flexibility, and pain.  ?  ?ACTIVITY LIMITATIONS community activity and recreation .  ?  ?PERSONAL FACTORS Behavior pattern and 1 comorbidity: previous injuries to L kne  are also affecting patient's functional outcome.  ?  ?  ?REHAB POTENTIAL: Excellent ?  ?CLINICAL DECISION MAKING: Stable/uncomplicated ?  ?EVALUATION COMPLEXITY: Low ?  ?  ?GOALS: ?Goals reviewed with patient? Yes ?  ?LONG TERM GOALS: Target date: 02/24/2022 ?  ?Pt will be I with HEP for hips, Rt knee strength, stability  ?Baseline: unknown ?Goal status: INITIAL ?  ?2.  Pt will increase hip strength to 5/5 for maximal knee stability with basketball   ?Baseline: 4 to 4-/5 ?Goal status: INITIAL ?  ?3.  Pt will be able to regularly go up and down stairs without knee pain  ?Baseline: pain most of the  time,  esp. ascending ?Goal status: INITIAL ?  ?4.  Pt will be able to run, perform light agility activities without increased knee pain  ?Baseline: pain, 4/10-5/10 with these  ?Goal status: INITIAL ?  ?5.

## 2022-01-29 ENCOUNTER — Encounter: Payer: Self-pay | Admitting: Physical Therapy

## 2022-01-29 ENCOUNTER — Ambulatory Visit: Payer: Commercial Managed Care - PPO | Admitting: Physical Therapy

## 2022-01-29 DIAGNOSIS — M25561 Pain in right knee: Secondary | ICD-10-CM | POA: Diagnosis not present

## 2022-01-29 NOTE — Therapy (Signed)
?OUTPATIENT PHYSICAL THERAPY TREATMENT NOTE ? ? ?Patient Name: Bradley Lane ?MRN: [id number] ?DOB:12-Jun-2001, 21 y.o., male ?Today's Date: 01/29/2022 ? ? ?END OF SESSION:  ? PT End of Session - 01/29/22 0715   ? ? Visit Number 6   ? Number of Visits 12   ? Date for PT Re-Evaluation 03/09/22   ? Authorization Type UHC/ UMR, MCD secondary   ? PT Start Time 0715   ? PT Stop Time 0755   ? PT Time Calculation (min) 40 min   ? ?  ?  ? ?  ? ? ? ?History reviewed. No pertinent past medical history. ?Past Surgical History:  ?Procedure Laterality Date  ? KIDNEY SURGERY    ? thinks there was a blockage  ? ?Patient Active Problem List  ? Diagnosis Date Noted  ? Rupture of right patellar tendon 12/28/2021  ? Preventative health care 12/03/2021  ? Acute pain of right knee 12/03/2021  ?PCP: Eden Emms, NP ?  ?REFERRING PROVIDER: Mort Sawyers, FNP ?  ?REFERRING DIAG: N82.956O (ICD-10-CM) - Rupture of right patellar tendon, initial encounter  ? ?THERAPY DIAG: Acute pain of right knee ? ? ?PERTINENT HISTORY: Right knee: states that he sprained it twice in the past year. Recently sprained it an additional 2 times.   ? ?PRECAUTIONS: Other: none   ? ?SUBJECTIVE: Pt reports knee pain is better.  Pt notes he has been completing his HEP. ? ?PAIN:  ?Are you having pain?Pain 0/10 on eval, with movement  Yes: NPRS scale: 0/10 ?Pain location: Rt knee inferior to patella  ?Pain description: stinging ?Aggravating factors: sitting, stairs, running  ?Relieving factors: stretching it out  ? ? ?OBJECTIVE: (objective measures completed at initial evaluation unless otherwise dated) ? ? ?OBJECTIVE:  ?  ?DIAGNOSTIC FINDINGS:  ?MRI ?Small, partial width, high-grade insertional tear involving the ?lateral aspect of the patellar tendon at the proximal attachment on ?the patella. Low-grade tendinosis or interstitial tearing medially. ?  ?No evidence of meniscus tear. Intact cruciate and collateral ?ligaments. ?  ?Low-grade chondral fissuring along the  medial patellar facet. ?  ?  ?PATIENT SURVEYS:  ?LEFS T ?            FOTO score 56% (prediction 80) ? FOTO score 72% ?COGNITION: ?          Overall cognitive status: Within functional limits for tasks assessed               ?           ?SENSATION: ?WFL ?  ?MUSCLE LENGTH: ?Hamstrings: Right 40 deg; Left 50 deg ?Thomas test: Right tight deg; Left tight deg ?  ?POSTURE:  ?Stands , sits with hips ER and abducted.  Genu varus , slight  ?  ?PALPATION: ?Min tenderness just inferior to patella  ?  ?LE ROM: ?  ?Active ROM Right ?01/13/2022 Left ?01/13/2022 Right  ?01/26/22  ?Hip flexion       ?Hip extension       ?Hip abduction       ?Hip adduction       ?Hip internal rotation       ?Hip external rotation       ?Knee flexion 135 140 140  ?Knee extension 4 0 0  ?Ankle dorsiflexion       ?Ankle plantarflexion       ?Ankle inversion       ?Ankle eversion       ? (Blank rows = not tested) ?8 degree quad  lag  ?LE MMT: ?  ?MMT Right ?01/13/2022 Left ?01/13/2022 Right ?01/26/22  ?Hip flexion 5 5   ?Hip extension 4 5   ?Hip abduction 4- 4 5/5  ?Hip adduction       ?Hip internal rotation       ?Hip external rotation       ?Knee flexion 5 5   ?Knee extension 5 5   ?Ankle dorsiflexion       ?Ankle plantarflexion       ?Ankle inversion       ?Ankle eversion       ? (Blank rows = not tested) ?  ?  ?FUNCTIONAL TESTS:  ?5 times sit to stand: WNL  ?Step ups WNL ?Step downs painful , shaky  ?Squats min pain with correction for hips and LE alignment  ?  ?GAIT: ?Distance walked: 150 ?Assistive device utilized: None ?Level of assistance: Complete Independence ?Comments: WFL no limp , good stride  ?  ?  ?  ?TODAY'S TREATMENT: ?[facility] Adult PT Treatment:                                                DATE: 01/28/22 ?Therapeutic Exercise: ?Elliptical Ramp 5 Resistance 3  x 5 min ?Rebounder toss SLS red ball ,  Airex 15 toss x2  ?Cone drill from SLS 10 x 2 - added airex for challenge today ?Wall Sit 5 sec x10 x2- 12# ?BOSU step up 10 x 2  ?Lunge with left  knee tap to BOSU x 10 ?4 inch forward step down with retro step up x 20 ?blue band  squat to chair tap x20 15# KB ?Lateral band walks with blue band 20 ft x 4 ? ?Side hip abduction 15 x 2, 5#  ?SLR with ER 15 x 2 , 5#  ?Single leg bridge R 15 x 2   10#  ? ?Hip flexor stretch 2 x 60 ?Prone quad stretch 2 x 60 ?Hamstring stretch with strap 3 x 30 ? ?[facility] Adult PT Treatment:                                                DATE: 01/26/22 ?Therapeutic Exercise: ?Nustep L5 LE only x 5 min ?Rebounder toss SLS red ball , added foam oval 20 toss without LOB ?Cone drill from SLS 10 x 2 touches without LOB ?4 inch lateral step down 10 x 2  ?8 inch runners step up 10 x 2  ?blue band  squat to chair tap  10 x 2 ?Lateral band walks with blue band 10 ft x 6 ?Wall Sit 5 sec x10 x2 ?Side hip abduction 10 x 3, 5#  ?SLR with ER 10 x 3, 5#  ?Single leg bridge R 10 x 2 each ? ?Hip flexor stretch 2 x 60 ?Prone quad stretch 2 x 60 ?Hamstring stretch with strap 3 x 30 ? ? ?[facility] Adult PT Treatment:                                                DATE: 01/22/22 ?Therapeutic Exercise: ?Nustep L5  LE only x 5 min ?4 inch lateral step down 10 x 2  ?6 inch runners step up 10 x 2  ?blue band  squat to chair tap  10 x 2, no pain ?Lateral band walks with blue band 10 ft x 6 ?Side hip abduction 10 x 3, 4#  ?SLR with ER 10 x 3, 4#  ?SAQ 4# 10 x 3 ?LAQ 4# 10 x 3  ?Hip flexor stretch 2 x 60 ?Prone quad stretch 2 x 60 ?Hamstring stretch with strap 3 x 30 ? ? ? ?[facility] Adult PT Treatment:                                                DATE: 01/19/22 ?Therapeutic Exercise: ?Rec bike L2 x 5 min ?Hip flexor stretch 1 x 60 ?Prone quad stretch 1 x 60 sec ?SLR with ER 10 x 3, 4#  ?Side hip abduction 10 x 3, 4#  ?LAQ reclined trunk 10 x2 4#, min pain ?Sit-stand with blue band 10 x 1, squat to chair tap  10 x 2, min pain  ?Lateral band walks with blue band 10 ft x 6 ?Lateral step ups c airex 2 x 10- cues for LE joint alignment ?Manual Therapy: ?CFM to the patellar tendon 3  min ?Self Care: ?CFM at home 2 to 3 x a day  ? ?[facility] Adult PT Treatment:                                                DATE: 01/15/22 ?Therapeutic Exercise: ?Rec bike L2 x 5 min ?Hip flexor stretch 3 x 30 ?Prone quad stretch 3 x 30 sec ?SLR with ER 10 x 2  ?Side hip abduction 10 x 2  ?Sit-stand with blue band 10 x 1, squat to chair tap  10 x 2  ?Lateral band walks with blue band 10 ft x 4  ?4 inch lateral step ups x 10- cues for LE joint alignment -increased pain - decreased motor control   ?  ?PATIENT EDUCATION:  ?Education details: HEP, POC ,anatomy  ?Person educated: Patient ?Education method: Explanation, Demonstration, Verbal cues, and Handouts ?Education comprehension: verbalized understanding, returned demonstration, and needs further education ?  ?  ?HOME EXERCISE PROGRAM: ?Access Code: [email or web address] ?URL: [email or web address] ?Date: 01/15/2022 ?Prepared by: Jannette Spanner ? ?Exercises ?- Straight Leg Raise with External Rotation  - 2 x daily - 7 x weekly - 2 sets - 10 reps - 5 hold ?- Prone Quadriceps Stretch with Strap  - 1 x daily - 7 x weekly - 1 sets - 3 reps - 30 hold ?- Modified Thomas Stretch  - 1 x daily - 7 x weekly - 1 sets - 3 reps - 30 hold ?- Sidelying Hip Abduction  - 1 x daily - 7 x weekly - 2 sets - 10 reps ?- Squat with Chair Touch and Resistance Loop  - 1 x daily - 7 x weekly - 3 sets - 10 reps ?- Side Stepping with Resistance at Thighs  - 1 x daily - 7 x weekly - 4 sets - 10 reps ?  ?ASSESSMENT: ?  ?CLINICAL IMPRESSION: ? PT was competed for R LE strengthening in open and closed kinetic chain.  Increased Single leg challenges on unlevel surface today without increased pain. Began agility with controlled hop from right to left and he did not have pain. He continues to report overall improvement with no pain on stairs or with sitting.   He is progressing toward LTGs. FOTO score improved.  He was asked to wear sneakers next visit.   ?  ?OBJECTIVE IMPAIRMENTS decreased mobility,  difficulty walking, decreased strength, impaired flexibility, and pain.  ?  ?ACTIVITY LIMITATIONS community activity and recreation .  ?  ?PERSONAL FACTORS Behavior pattern and 1 comorbidity: previous injur

## 2022-02-02 ENCOUNTER — Encounter: Payer: Self-pay | Admitting: Physical Therapy

## 2022-02-02 ENCOUNTER — Ambulatory Visit: Payer: Commercial Managed Care - PPO | Admitting: Physical Therapy

## 2022-02-02 DIAGNOSIS — M25561 Pain in right knee: Secondary | ICD-10-CM

## 2022-02-02 NOTE — Therapy (Signed)
?OUTPATIENT PHYSICAL THERAPY TREATMENT NOTE ? ? ?Patient Name: Bradley Lane ?MRN: [id number] ?DOB:[birth year], 21 y.o., male ?Today's Date: 02/02/2022 ? ? ?END OF SESSION:  ? PT End of Session - 02/02/22 0725   ? ? Visit Number 7   ? Number of Visits 12   ? Date for PT Re-Evaluation 03/09/22   ? Authorization Type UHC/ UMR, MCD secondary   ? PT Start Time 0715   ? PT Stop Time 0800   ? PT Time Calculation (min) 45 min   ? ?  ?  ? ?  ? ? ? ?History reviewed. No pertinent past medical history. ?Past Surgical History:  ?Procedure Laterality Date  ? KIDNEY SURGERY    ? thinks there was a blockage  ? ?Patient Active Problem List  ? Diagnosis Date Noted  ? Rupture of right patellar tendon 12/28/2021  ? Preventative health care 12/03/2021  ? Acute pain of right knee 12/03/2021  ?PCP: Michela Pitcher, NP ?  ?REFERRING PROVIDER: Eugenia Pancoast, FNP ?  ?REFERRING DIAG: [id number] (ICD-10-CM) - Rupture of right patellar tendon, initial encounter  ? ?THERAPY DIAG: Acute pain of right knee ? ? ?PERTINENT HISTORY: Right knee: states that he sprained it twice in the past year. Recently sprained it an additional 2 times.   ? ?PRECAUTIONS: Other: none   ? ?SUBJECTIVE: Pt reports knee pain is better.  Pt notes he has been completing his HEP. ? ?PAIN:  ?Are you having pain?Pain 0/10 on eval, with movement  Yes: NPRS scale: 0/10 ?Pain location: Rt knee inferior to patella  ?Pain description: stinging ?Aggravating factors: sitting, stairs, running  ?Relieving factors: stretching it out  ? ? ?OBJECTIVE: (objective measures completed at initial evaluation unless otherwise dated) ? ? ?OBJECTIVE:  ?  ?DIAGNOSTIC FINDINGS:  ?MRI ?Small, partial width, high-grade insertional tear involving the ?lateral aspect of the patellar tendon at the proximal attachment on ?the patella. Low-grade tendinosis or interstitial tearing medially. ?  ?No evidence of meniscus tear. Intact cruciate and collateral ?ligaments. ?  ?Low-grade chondral fissuring along the  medial patellar facet. ?  ?  ?PATIENT SURVEYS:  ?LEFS T ?            FOTO score 56% (prediction 80) ? FOTO score 72% ?COGNITION: ?          Overall cognitive status: Within functional limits for tasks assessed               ?           ?SENSATION: ?WFL ?  ?MUSCLE LENGTH: ?Hamstrings: Right 40 deg; Left 50 deg ?Thomas test: Right tight deg; Left tight deg ?  ?POSTURE:  ?Stands , sits with hips ER and abducted.  Genu varus , slight  ?  ?PALPATION: ?Min tenderness just inferior to patella  ?  ?LE ROM: ?  ?Active ROM Right ?01/13/2022 Left ?01/13/2022 Right  ?01/26/22  ?Hip flexion       ?Hip extension       ?Hip abduction       ?Hip adduction       ?Hip internal rotation       ?Hip external rotation       ?Knee flexion 135 140 140  ?Knee extension 4 0 0  ?Ankle dorsiflexion       ?Ankle plantarflexion       ?Ankle inversion       ?Ankle eversion       ? (Blank rows = not tested) ?8 degree quad  lag  ?LE MMT: ?  ?MMT Right ?01/13/2022 Left ?01/13/2022 Right ?01/26/22 Right ?02/02/22  ?Hip flexion 5 5    ?Hip extension 4 5  4+/5  ?Hip abduction 4- 4 5/5   ?Hip adduction        ?Hip internal rotation        ?Hip external rotation        ?Knee flexion 5 5    ?Knee extension 5 5    ?Ankle dorsiflexion        ?Ankle plantarflexion        ?Ankle inversion        ?Ankle eversion        ? (Blank rows = not tested) ?  ?  ?FUNCTIONAL TESTS:  ?5 times sit to stand: WNL  ?Step ups WNL ?Step downs painful , shaky  ?Squats min pain with correction for hips and LE alignment  ?  ?GAIT: ?Distance walked: 150 ?Assistive device utilized: None ?Level of assistance: Complete Independence ?Comments: WFL no limp , good stride  ?  ?  ?  ?TODAY'S TREATMENT: ?[facility] Adult PT Treatment:                                                DATE: 02/02/22 ?Therapeutic Exercise: ?Elliptical Ramp 5 Resistance 5  x 5 min ?Hands on wall alternating hops to each leg ?Side shuffling 1 minute ?High knees 1 minute ?Squat jump in place 5 reps x 3  ?Karaoke x 1 minute   ?Rebounder toss SLS yellow ball ,  Airex 15 toss x2  ?Cone drill reach from SLS 10 x 2 - airex pad  ?Wall Sit 5 sec x10 x2- 14# ?Lunge with left knee tap to BOSU x 10 each side  ?Dynamic lunge 50 feet x 2  ?6 inch forward step down with retro step up x 10 ?6 inch forward step down- heel strike only 10 x 2 ?Single leg sit-stand 10 x 2 no weight  ? ?Prone quad stretch 2 x 60 sec ?Hamstring stretch with strap 3 x 30 sec ? ?[facility] Adult PT Treatment:                                                DATE: 01/28/22 ?Therapeutic Exercise: ?Elliptical Ramp 5 Resistance 3  x 5 min ?Rebounder toss SLS red ball ,  Airex 15 toss x2  ?Cone drill from SLS 10 x 2 - added airex for challenge today ?Wall Sit 5 sec x10 x2- 12# ?BOSU step up 10 x 2  ?Lunge with left knee tap to BOSU x 10 ?4 inch forward step down with retro step up x 20 ?blue band  squat to chair tap x20 15# KB ?Lateral band walks with blue band 20 ft x 4 ?Hands on wall alternating hops to single leg  ?Side hip abduction 15 x 2, 5#  ?SLR with ER 15 x 2 , 5#  ?Single leg bridge R 15 x 2   10#  ? ?Hip flexor stretch 2 x 60 ?Prone quad stretch 2 x 60 ?Hamstring stretch with strap 3 x 30 ? ?[facility] Adult PT Treatment:  DATE: 01/26/22 ?Therapeutic Exercise: ?Nustep L5 LE only x 5 min ?Rebounder toss SLS red ball , added foam oval 20 toss without LOB ?Cone drill from SLS 10 x 2 touches without LOB ?4 inch lateral step down 10 x 2  ?8 inch runners step up 10 x 2  ?blue band  squat to chair tap  10 x 2 ?Lateral band walks with blue band 10 ft x 6 ?Wall Sit 5 sec x10 x2 ?Side hip abduction 10 x 3, 5#  ?SLR with ER 10 x 3, 5#  ?Single leg bridge R 10 x 2 each ? ?Hip flexor stretch 2 x 60 ?Prone quad stretch 2 x 60 ?Hamstring stretch with strap 3 x 30 ? ? ?[facility] Adult PT Treatment:                                                DATE: 01/22/22 ?Therapeutic Exercise: ?Nustep L5 LE only x 5 min ?4 inch lateral step down 10 x 2  ?6 inch runners step  up 10 x 2  ?blue band  squat to chair tap  10 x 2, no pain ?Lateral band walks with blue band 10 ft x 6 ?Side hip abduction 10 x 3, 4#  ?SLR with ER 10 x 3, 4#  ?SAQ 4# 10 x 3 ?LAQ 4# 10 x 3  ?Hip flexor stretch 2 x 60 ?Prone quad stretch 2 x 60 ?Hamstring stretch with strap 3 x 30 ? ? ? ?[facility] Adult PT Treatment:                                                DATE: 01/19/22 ?Therapeutic Exercise: ?Rec bike L2 x 5 min ?Hip flexor stretch 1 x 60 ?Prone quad stretch 1 x 60 sec ?SLR with ER 10 x 3, 4#  ?Side hip abduction 10 x 3, 4#  ?LAQ reclined trunk 10 x2 4#, min pain ?Sit-stand with blue band 10 x 1, squat to chair tap  10 x 2, min pain  ?Lateral band walks with blue band 10 ft x 6 ?Lateral step ups c airex 2 x 10- cues for LE joint alignment ?Manual Therapy: ?CFM to the patellar tendon 3 min ?Self Care: ?CFM at home 2 to 3 x a day  ? ?[facility] Adult PT Treatment:                                                DATE: 01/15/22 ?Therapeutic Exercise: ?Rec bike L2 x 5 min ?Hip flexor stretch 3 x 30 ?Prone quad stretch 3 x 30 sec ?SLR with ER 10 x 2  ?Side hip abduction 10 x 2  ?Sit-stand with blue band 10 x 1, squat to chair tap  10 x 2  ?Lateral band walks with blue band 10 ft x 4  ?4 inch lateral step ups x 10- cues for LE joint alignment -increased pain - decreased motor control   ?  ?PATIENT EDUCATION:  ?Education details: HEP, POC ,anatomy  ?Person educated: Patient ?Education method: Explanation, Demonstration, Verbal cues, and Handouts ?Education comprehension: verbalized understanding,  returned demonstration, and needs further education ?  ?  ?HOME EXERCISE PROGRAM: ?Access Code: [email or web address] ?URL: [email or web address] ?Date: 01/15/2022 ?Prepared by: Hessie Diener ? ?Exercises ?- Straight Leg Raise with External Rotation  - 2 x daily - 7 x weekly - 2 sets - 10 reps - 5 hold ?- Prone Quadriceps Stretch with Strap  - 1 x daily - 7 x weekly - 1 sets - 3 reps - 30 hold ?- Modified Thomas Stretch  - 1 x daily -  7 x weekly - 1 sets - 3 reps - 30 hold ?- Sidelying Hip Abduction  - 1 x daily - 7 x weekly - 2 sets - 10 reps ?- Squat with Chair Touch and Resistance Loop  - 1 x daily - 7 x weekly - 3 sets - 10 reps ?- S

## 2022-02-05 ENCOUNTER — Ambulatory Visit: Payer: Commercial Managed Care - PPO | Admitting: Physical Therapy

## 2022-02-05 ENCOUNTER — Encounter: Payer: Self-pay | Admitting: Physical Therapy

## 2022-02-05 DIAGNOSIS — M25561 Pain in right knee: Secondary | ICD-10-CM

## 2022-02-05 NOTE — Therapy (Signed)
OUTPATIENT PHYSICAL THERAPY TREATMENT NOTE   Patient Name: Bradley Lane MRN: [id number] DOB:[birth year], 21 y.o., male Today's Date: 02/05/2022   END OF SESSION:   PT End of Session - 02/05/22 0720     Visit Number 8    Number of Visits 12    Date for PT Re-Evaluation 03/09/22    Authorization Type UHC/ UMR, MCD secondary    PT Start Time 0717    PT Stop Time 0755    PT Time Calculation (min) 38 min              History reviewed. No pertinent past medical history. Past Surgical History:  Procedure Laterality Date   KIDNEY SURGERY     thinks there was a blockage   Patient Active Problem List   Diagnosis Date Noted   Rupture of right patellar tendon 12/28/2021   Preventative health care 12/03/2021   Acute pain of right knee 12/03/2021  PCP: Michela Pitcher, NP   REFERRING PROVIDER: Eugenia Pancoast, FNP   REFERRING DIAG: [phone] (ICD-10-CM) - Rupture of right patellar tendon, initial encounter   THERAPY DIAG: Acute pain of right knee   PERTINENT HISTORY: Right knee: states that he sprained it twice in the past year. Recently sprained it an additional 2 times.    PRECAUTIONS: Other: none    SUBJECTIVE: I was sore in my thighs and hamstrings after last session. Not too sore now. No knee pain.   PAIN:  Are you having pain?Pain 0/10 on eval, with movement  Yes: NPRS scale: 0/10 Pain location: Rt knee inferior to patella  Pain description: stinging Aggravating factors: sitting, stairs, running  Relieving factors: stretching it out    OBJECTIVE: (objective measures completed at initial evaluation unless otherwise dated)   OBJECTIVE:    DIAGNOSTIC FINDINGS:  MRI Small, partial width, high-grade insertional tear involving the lateral aspect of the patellar tendon at the proximal attachment on the patella. Low-grade tendinosis or interstitial tearing medially.   No evidence of meniscus tear. Intact cruciate and collateral ligaments.   Low-grade chondral  fissuring along the medial patellar facet.     PATIENT SURVEYS:  LEFS T             FOTO score 56% (prediction 80)  FOTO score 72%  COGNITION:           Overall cognitive status: Within functional limits for tasks assessed                          SENSATION: WFL   MUSCLE LENGTH: Hamstrings: Right 40 deg; Left 50 deg Thomas test: Right tight deg; Left tight deg   POSTURE:  Stands , sits with hips ER and abducted.  Genu varus , slight    PALPATION: Min tenderness just inferior to patella    LE ROM:   Active ROM Right 01/13/2022 Left 01/13/2022 Right  01/26/22  Hip flexion       Hip extension       Hip abduction       Hip adduction       Hip internal rotation       Hip external rotation       Knee flexion 135 140 140  Knee extension 4 0 0  Ankle dorsiflexion       Ankle plantarflexion       Ankle inversion       Ankle eversion        (Blank rows =  not tested) 8 degree quad lag  LE MMT:   MMT Right 01/13/2022 Left 01/13/2022 Right 01/26/22 Right 02/02/22  Hip flexion 5 5    Hip extension 4 5  4+/5  Hip abduction 4- 4 5/5   Hip adduction        Hip internal rotation        Hip external rotation        Knee flexion 5 5    Knee extension 5 5    Ankle dorsiflexion        Ankle plantarflexion        Ankle inversion        Ankle eversion         (Blank rows = not tested)     FUNCTIONAL TESTS:  5 times sit to stand: WNL  Step ups WNL Step downs painful , shaky  Squats min pain with correction for hips and LE alignment    GAIT: Distance walked: 150 Assistive device utilized: None Level of assistance: Complete Independence Comments: WFL no limp , good stride        TODAY'S TREATMENT: [facility] Adult PT Treatment:                                                DATE: 02/05/22 Therapeutic Exercise: Elliptical Ramp 5 Resistance 5  x 5 min Side shuffling 1 minute High knees 1 minute Karaoke x 1 minute  Squat jump in place 5 reps Long jump 5 reps x 4  Rebounder  toss SLS yellow ball on Airex 15 toss x2  Cone drill reach from SLS 10 x 2 - airex pad  Dynamic lunge 50 feet x 4 Wall Sit 5 sec x10 x2- 16#  8 inch step up x20  6 inch forward step down- heel strike only 10 x 2 Single leg sit-stand 10 x 2 no weight   Prone quad stretch 2 x 60 sec Hamstring stretch with strap 3 x 30 sec  [facility] Adult PT Treatment:                                                DATE: 02/02/22 Therapeutic Exercise: Elliptical Ramp 5 Resistance 5  x 5 min Hands on wall alternating hops to each leg Side shuffling 1 minute High knees 1 minute Squat jump in place 5 reps x 3  Karaoke x 1 minute  Rebounder toss SLS yellow ball ,  Airex 15 toss x2  Cone drill reach from SLS 10 x 2 - airex pad  Wall Sit 5 sec x10 x2- 14# Lunge with left knee tap to BOSU x 10 each side  Dynamic lunge 50 feet x 2  6 inch forward step down with retro step up x 10 6 inch forward step down- heel strike only 10 x 2 Single leg sit-stand 10 x 2 no weight   Prone quad stretch 2 x 60 sec Hamstring stretch with strap 3 x 30 sec  [facility] Adult PT Treatment:  DATE: 01/28/22 Therapeutic Exercise: Elliptical Ramp 5 Resistance 3  x 5 min Rebounder toss SLS red ball ,  Airex 15 toss x2  Cone drill from SLS 10 x 2 - added airex for challenge today Wall Sit 5 sec x10 x2- 12# BOSU step up 10 x 2  Lunge with left knee tap to BOSU x 10 4 inch forward step down with retro step up x 20 blue band  squat to chair tap x20 15# KB Lateral band walks with blue band 20 ft x 4 Hands on wall alternating hops to single leg  Side hip abduction 15 x 2, 5#  SLR with ER 15 x 2 , 5#  Single leg bridge R 15 x 2   10#   Hip flexor stretch 2 x 60 Prone quad stretch 2 x 60 Hamstring stretch with strap 3 x 30  [facility] Adult PT Treatment:                                                DATE: 01/26/22 Therapeutic Exercise: Nustep L5 LE only x 5 min Rebounder toss SLS red ball , added  foam oval 20 toss without LOB Cone drill from SLS 10 x 2 touches without LOB 4 inch lateral step down 10 x 2  8 inch runners step up 10 x 2  blue band  squat to chair tap  10 x 2 Lateral band walks with blue band 10 ft x 6 Wall Sit 5 sec x10 x2 Side hip abduction 10 x 3, 5#  SLR with ER 10 x 3, 5#  Single leg bridge R 10 x 2 each  Hip flexor stretch 2 x 60 Prone quad stretch 2 x 60 Hamstring stretch with strap 3 x 30   [facility] Adult PT Treatment:                                                DATE: 01/22/22 Therapeutic Exercise: Nustep L5 LE only x 5 min 4 inch lateral step down 10 x 2  6 inch runners step up 10 x 2  blue band  squat to chair tap  10 x 2, no pain Lateral band walks with blue band 10 ft x 6 Side hip abduction 10 x 3, 4#  SLR with ER 10 x 3, 4#  SAQ 4# 10 x 3 LAQ 4# 10 x 3  Hip flexor stretch 2 x 60 Prone quad stretch 2 x 60 Hamstring stretch with strap 3 x 30    [facility] Adult PT Treatment:                                                DATE: 01/19/22 Therapeutic Exercise: Rec bike L2 x 5 min Hip flexor stretch 1 x 60 Prone quad stretch 1 x 60 sec SLR with ER 10 x 3, 4#  Side hip abduction 10 x 3, 4#  LAQ reclined trunk 10 x2 4#, min pain Sit-stand with blue band 10 x 1, squat to chair tap  10 x 2, min pain  Lateral band walks with blue  band 10 ft x 6 Lateral step ups c airex 2 x 10- cues for LE joint alignment Manual Therapy: CFM to the patellar tendon 3 min Self Care: CFM at home 2 to 3 x a day   [facility] Adult PT Treatment:                                                DATE: 01/15/22 Therapeutic Exercise: Rec bike L2 x 5 min Hip flexor stretch 3 x 30 Prone quad stretch 3 x 30 sec SLR with ER 10 x 2  Side hip abduction 10 x 2  Sit-stand with blue band 10 x 1, squat to chair tap  10 x 2  Lateral band walks with blue band 10 ft x 4  4 inch lateral step ups x 10- cues for LE joint alignment -increased pain - decreased motor control     PATIENT EDUCATION:   Education details: HEP, POC ,anatomy  Person educated: Patient Education method: Explanation, Demonstration, Verbal cues, and Handouts Education comprehension: verbalized understanding, returned demonstration, and needs further education     HOME EXERCISE PROGRAM: Access Code: [email or web address] URL: [email or web address] Date: 01/15/2022 Prepared by: Hessie Diener  Exercises - Straight Leg Raise with External Rotation  - 2 x daily - 7 x weekly - 2 sets - 10 reps - 5 hold - Prone Quadriceps Stretch with Strap  - 1 x daily - 7 x weekly - 1 sets - 3 reps - 30 hold - Modified Thomas Stretch  - 1 x daily - 7 x weekly - 1 sets - 3 reps - 30 hold - Sidelying Hip Abduction  - 1 x daily - 7 x weekly - 2 sets - 10 reps - Squat with Chair Touch and Resistance Loop  - 1 x daily - 7 x weekly - 3 sets - 10 reps - Side Stepping with Resistance at Thighs  - 1 x daily - 7 x weekly - 4 sets - 10 reps   ASSESSMENT:   CLINICAL IMPRESSION:  PT was competed for R LE strengthening in closed kinetic chain. Progressed to long jump without pain and good form/ landing. Continued with plyometric, agility, and advanced strengthening challenges  without increased pain.   Hip  He has partially met most LTGS. Will continue to progress as able.    OBJECTIVE IMPAIRMENTS decreased mobility, difficulty walking, decreased strength, impaired flexibility, and pain.    ACTIVITY LIMITATIONS community activity and recreation .    PERSONAL FACTORS Behavior pattern and 1 comorbidity: previous injuries to L kne  are also affecting patient's functional outcome.      REHAB POTENTIAL: Excellent   CLINICAL DECISION MAKING: Stable/uncomplicated   EVALUATION COMPLEXITY: Low     GOALS: Goals reviewed with patient? Yes   LONG TERM GOALS: Target date: 02/24/2022   Pt will be I with HEP for hips, Rt knee strength, stability  Baseline: unknown Goal status: MET   2.  Pt will increase hip strength to 5/5 for maximal  knee stability with basketball   Baseline: 4 to 4-/5 Status: 02/02/22- 4+/5 to 5/5 Goal status: NEARLY MET   3.  Pt will be able to regularly go up and down stairs without knee pain  Baseline: pain most of the time, esp. Ascending Status: no pain lately on stairs 01/28/22 Goal status: PARTIALLY MET   4.  Pt  will be able to run, perform light agility activities without increased knee pain  Baseline: pain, 4/10-5/10 with these  Status: 02/02/22- no pain with agility in clinic Goal status: PARTIALLY MET   5.  Pt will improve FOTO score to 80% or better  Baseline: 56% Status 72% Goal status: ONGOING   PLAN: PT FREQUENCY: 2x/week   PT DURATION: 6 weeks   PLANNED INTERVENTIONS: Therapeutic exercises, Therapeutic activity, Neuromuscular re-education, Balance training, Gait training, Patient/Family education, Joint mobilization, Cryotherapy, Taping, Ionotophoresis 94m/ml Dexamethasone, and Manual therapy   PLAN FOR NEXT SESSION: assess response to last visit; consider beginner agility if pt wears sneakers.   Check all possible CPT codes: 97110- Therapeutic Exercise, [phone] Neuro Re-education, [phone]- Gait Training, [phone]- Manual Therapy, 97530 - Therapeutic Activities, [phone]- Self Care, and [phone]- Iontophoresis     If treatment provided at initial evaluation, no treatment charged due to lack of authorization.    JHessie Diener PTA 02/05/22 7:21 AM Phone: [phone]: [phone]

## 2022-02-09 ENCOUNTER — Encounter: Payer: Self-pay | Admitting: Physical Therapy

## 2022-02-09 ENCOUNTER — Ambulatory Visit: Payer: Commercial Managed Care - PPO | Admitting: Physical Therapy

## 2022-02-09 DIAGNOSIS — M25561 Pain in right knee: Secondary | ICD-10-CM | POA: Diagnosis not present

## 2022-02-09 NOTE — Therapy (Addendum)
OUTPATIENT PHYSICAL THERAPY TREATMENT NOTE DISCHARGE   Patient Name: Bradley Lane MRN: [id number] DOB:[birth year], 21 y.o., male Today's Date: 02/09/2022   END OF SESSION:   PT End of Session - 02/09/22 0719     Visit Number 9    Number of Visits 12    Date for PT Re-Evaluation 03/09/22    Authorization Type UHC/ UMR, MCD secondary    PT Start Time 0715    PT Stop Time 0748    PT Time Calculation (min) 33 min              History reviewed. No pertinent past medical history. Past Surgical History:  Procedure Laterality Date   KIDNEY SURGERY     thinks there was a blockage   Patient Active Problem List   Diagnosis Date Noted   Rupture of right patellar tendon 12/28/2021   Preventative health care 12/03/2021   Acute pain of right knee 12/03/2021  PCP: Michela Pitcher, NP   REFERRING PROVIDER: Eugenia Pancoast, FNP   REFERRING DIAG: [phone] (ICD-10-CM) - Rupture of right patellar tendon, initial encounter   THERAPY DIAG: Acute pain of right knee  Rationale for Evaluation and Treatment Rehabilitation    PERTINENT HISTORY: Right knee: states that he sprained it twice in the past year. Recently sprained it an additional 2 times.    PRECAUTIONS: Other: none    SUBJECTIVE: I was sore in my thighs and hamstrings after last session. Not too sore now. No knee pain.   PAIN:  Are you having pain?Pain 0/10 on eval, with movement  No: NPRS scale: 0/10    OBJECTIVE: (objective measures completed at initial evaluation unless otherwise dated)      DIAGNOSTIC FINDINGS:  MRI Small, partial width, high-grade insertional tear involving the lateral aspect of the patellar tendon at the proximal attachment on the patella. Low-grade tendinosis or interstitial tearing medially.   No evidence of meniscus tear. Intact cruciate and collateral ligaments.   Low-grade chondral fissuring along the medial patellar facet.     PATIENT SURVEYS:  LEFS T             FOTO score 56%  (prediction 80)  FOTO score 01/29/22:  72%  FOTO score 02/09/22: 99%  COGNITION:           Overall cognitive status: Within functional limits for tasks assessed                          SENSATION: WFL   MUSCLE LENGTH: Hamstrings: Right 40 deg; Left 50 deg Thomas test: Right tight deg; Left tight deg   POSTURE:  Stands , sits with hips ER and abducted.  Genu varus , slight    PALPATION: Min tenderness just inferior to patella    LE ROM:   Active ROM Right 01/13/2022 Left 01/13/2022 Right  01/26/22  Hip flexion       Hip extension       Hip abduction       Hip adduction       Hip internal rotation       Hip external rotation       Knee flexion 135 140 140  Knee extension 4 0 0  Ankle dorsiflexion       Ankle plantarflexion       Ankle inversion       Ankle eversion        (Blank rows = not tested) 8 degree quad lag  LE MMT:   MMT Right 01/13/2022 Left 01/13/2022 Right 01/26/22 Right 02/02/22 Right 02/09/22  Hip flexion 5 5     Hip extension 4 5  4+/5 5/5  Hip abduction 4- 4 5/5  5/5  Hip adduction         Hip internal rotation         Hip external rotation         Knee flexion 5 5     Knee extension 5 5     Ankle dorsiflexion         Ankle plantarflexion         Ankle inversion         Ankle eversion          (Blank rows = not tested)     FUNCTIONAL TESTS:  5 times sit to stand: WNL  Step ups WNL Step downs painful , shaky  Squats min pain with correction for hips and LE alignment    GAIT: Distance walked: 150 Assistive device utilized: None Level of assistance: Complete Independence Comments: WFL no limp , good stride        TODAY'S TREATMENT: [facility] Adult PT Treatment:                                                DATE: 02/09/22 Therapeutic Exercise: Elliptical Ramp 5 Resistance 5  x 5 min Side shuffling 1 minute High knees 1 minute Single leg hop 5 x 2 = equal distance R vs L Dynamic lunge 50 feet x 4 Long jump 5 reps x 4  Rebounder toss SLS  yellow ball on Airex 15 toss x2  Cone drill reach from SLS 10 x 2 - airex pad  8 inch step up x20 6 inch forward step down- heel strike only 10 x 2 FOTO 99% Prone quad stretch 2 x 60 sec Hamstring stretch with strap 3 x 30 sec  [facility] Adult PT Treatment:                                                DATE: 02/05/22 Therapeutic Exercise: Elliptical Ramp 5 Resistance 5  x 5 min Side shuffling 1 minute High knees 1 minute Karaoke x 1 minute  Squat jump in place 5 reps Long jump 5 reps x 4  Rebounder toss SLS yellow ball on Airex 15 toss x2  Cone drill reach from SLS 10 x 2 - airex pad  Dynamic lunge 50 feet x 4 Wall Sit 5 sec x10 x2- 16#  8 inch step up x20  6 inch forward step down- heel strike only 10 x 2 Single leg sit-stand 10 x 2 no weight   Prone quad stretch 2 x 60 sec Hamstring stretch with strap 3 x 30 sec  [facility] Adult PT Treatment:                                                DATE: 02/02/22 Therapeutic Exercise: Elliptical Ramp 5 Resistance 5  x 5 min Hands on wall alternating hops to each leg Side shuffling 1 minute  High knees 1 minute Squat jump in place 5 reps x 3  Karaoke x 1 minute  Rebounder toss SLS yellow ball ,  Airex 15 toss x2  Cone drill reach from SLS 10 x 2 - airex pad  Wall Sit 5 sec x10 x2- 14# Lunge with left knee tap to BOSU x 10 each side  Dynamic lunge 50 feet x 2  6 inch forward step down with retro step up x 10 6 inch forward step down- heel strike only 10 x 2 Single leg sit-stand 10 x 2 no weight   Prone quad stretch 2 x 60 sec Hamstring stretch with strap 3 x 30 sec  [facility] Adult PT Treatment:                                                DATE: 01/28/22 Therapeutic Exercise: Elliptical Ramp 5 Resistance 3  x 5 min Rebounder toss SLS red ball ,  Airex 15 toss x2  Cone drill from SLS 10 x 2 - added airex for challenge today Wall Sit 5 sec x10 x2- 12# BOSU step up 10 x 2  Lunge with left knee tap to BOSU x 10 4 inch forward step down  with retro step up x 20 blue band  squat to chair tap x20 15# KB Lateral band walks with blue band 20 ft x 4 Hands on wall alternating hops to single leg  Side hip abduction 15 x 2, 5#  SLR with ER 15 x 2 , 5#  Single leg bridge R 15 x 2   10#   Hip flexor stretch 2 x 60 Prone quad stretch 2 x 60 Hamstring stretch with strap 3 x 30  [facility] Adult PT Treatment:                                                DATE: 01/26/22 Therapeutic Exercise: Nustep L5 LE only x 5 min Rebounder toss SLS red ball , added foam oval 20 toss without LOB Cone drill from SLS 10 x 2 touches without LOB 4 inch lateral step down 10 x 2  8 inch runners step up 10 x 2  blue band  squat to chair tap  10 x 2 Lateral band walks with blue band 10 ft x 6 Wall Sit 5 sec x10 x2 Side hip abduction 10 x 3, 5#  SLR with ER 10 x 3, 5#  Single leg bridge R 10 x 2 each  Hip flexor stretch 2 x 60 Prone quad stretch 2 x 60 Hamstring stretch with strap 3 x 30   [facility] Adult PT Treatment:                                                DATE: 01/22/22 Therapeutic Exercise: Nustep L5 LE only x 5 min 4 inch lateral step down 10 x 2  6 inch runners step up 10 x 2  blue band  squat to chair tap  10 x 2, no pain Lateral band walks with blue band 10 ft x 6 Side hip abduction  10 x 3, 4#  SLR with ER 10 x 3, 4#  SAQ 4# 10 x 3 LAQ 4# 10 x 3  Hip flexor stretch 2 x 60 Prone quad stretch 2 x 60 Hamstring stretch with strap 3 x 30    [facility] Adult PT Treatment:                                                DATE: 01/19/22 Therapeutic Exercise: Rec bike L2 x 5 min Hip flexor stretch 1 x 60 Prone quad stretch 1 x 60 sec SLR with ER 10 x 3, 4#  Side hip abduction 10 x 3, 4#  LAQ reclined trunk 10 x2 4#, min pain Sit-stand with blue band 10 x 1, squat to chair tap  10 x 2, min pain  Lateral band walks with blue band 10 ft x 6 Lateral step ups c airex 2 x 10- cues for LE joint alignment Manual Therapy: CFM to the patellar tendon 3  min Self Care: CFM at home 2 to 3 x a day   [facility] Adult PT Treatment:                                                DATE: 01/15/22 Therapeutic Exercise: Rec bike L2 x 5 min Hip flexor stretch 3 x 30 Prone quad stretch 3 x 30 sec SLR with ER 10 x 2  Side hip abduction 10 x 2  Sit-stand with blue band 10 x 1, squat to chair tap  10 x 2  Lateral band walks with blue band 10 ft x 4  4 inch lateral step ups x 10- cues for LE joint alignment -increased pain - decreased motor control     PATIENT EDUCATION:  Education details: HEP, POC ,anatomy  Person educated: Patient Education method: Explanation, Demonstration, Verbal cues, and Handouts Education comprehension: verbalized understanding, returned demonstration, and needs further education     HOME EXERCISE PROGRAM: Access Code: [email or web address] URL: [email or web address] Date: 01/15/2022 Prepared by: Hessie Diener  Exercises - Straight Leg Raise with External Rotation  - 2 x daily - 7 x weekly - 2 sets - 10 reps - 5 hold - Prone Quadriceps Stretch with Strap  - 1 x daily - 7 x weekly - 1 sets - 3 reps - 30 hold - Modified Thomas Stretch  - 1 x daily - 7 x weekly - 1 sets - 3 reps - 30 hold - Sidelying Hip Abduction  - 1 x daily - 7 x weekly - 2 sets - 10 reps - Squat with Chair Touch and Resistance Loop  - 1 x daily - 7 x weekly - 3 sets - 10 reps - Side Stepping with Resistance at Thighs  - 1 x daily - 7 x weekly - 4 sets - 10 reps   ASSESSMENT:   CLINICAL IMPRESSION: Pt reports he was able to play basketball for 2 hours without pain or difficulty last week. He is performing advanced agility and closed chain strength in clinic without difficulty and good control.  He reports no pain over last couple of weeks. Today, performed single leg hop test and he demonstrated equal distance Rt vs Lt and reported that he felt the  same on both sides. His FOTO score reached 99% today and his MMT has reached 5/5 in hips.  He is agreeable to  discharge to HEP at this time. All LTG's MET.    OBJECTIVE IMPAIRMENTS decreased mobility, difficulty walking, decreased strength, impaired flexibility, and pain.    ACTIVITY LIMITATIONS community activity and recreation .    PERSONAL FACTORS Behavior pattern and 1 comorbidity: previous injuries to L kne  are also affecting patient's functional outcome.      REHAB POTENTIAL: Excellent   CLINICAL DECISION MAKING: Stable/uncomplicated   EVALUATION COMPLEXITY: Low     GOALS: Goals reviewed with patient? Yes   LONG TERM GOALS: Target date: 02/24/2022   Pt will be I with HEP for hips, Rt knee strength, stability  Baseline: unknown Goal status: MET   2.  Pt will increase hip strength to 5/5 for maximal knee stability with basketball   Baseline: 4 to 4-/5 Status: 02/02/22- 4+/5 to 5/5 Status: 02/09/22 5/5 Goal status: MET   3.  Pt will be able to regularly go up and down stairs without knee pain  Baseline: pain most of the time, esp. Ascending Status: no pain lately on stairs 01/28/22 Status: continued no pain on stairs 02/09/22 Goal status:  MET   4.  Pt will be able to run, perform light agility activities without increased knee pain  Baseline: pain, 4/10-5/10 with these  Status: 02/02/22- no pain with agility in clinic Status: 02/09/22- no pain with 2 hours of basketball at [facility]  Goal status: MET   5.  Pt will improve FOTO score to 80% or better  Baseline: 56% Status 72% Status: 999% Goal status: MET   PLAN: PT FREQUENCY: 2x/week   PT DURATION: 6 weeks   PLANNED INTERVENTIONS: Therapeutic exercises, Therapeutic activity, Neuromuscular re-education, Balance training, Gait training, Patient/Family education, Joint mobilization, Cryotherapy, Taping, Ionotophoresis 34m/ml Dexamethasone, and Manual therapy   PLAN FOR NEXT SESSION: N/A discharge to HEP today  Check all possible CPT codes: 97110- Therapeutic Exercise, [phone] Neuro Re-education, [phone]- Gait Training, [phone]-  Manual Therapy, 97530 - Therapeutic Activities, 97535 - Self Care, and [phone]- Iontophoresis     If treatment provided at initial evaluation, no treatment charged due to lack of authorization.    JHessie Diener PTA 02/09/22 8:58 AM Phone: [phone]: [phone]     PHYSICAL THERAPY DISCHARGE SUMMARY  Visits from Start of Care: 9  Current functional level related to goals / functional outcomes: See above    Remaining deficits: None limiting function, back to baseline activity without knee pain    Education / Equipment: HEP, agility, strengthening, RICE    Patient agrees to discharge. Patient goals were met. Patient is being discharged due to meeting the stated rehab goals.  JRaeford Razor PT 02/09/22 8:59 AM Phone: [phone]: [phone]

## 2022-02-11 ENCOUNTER — Ambulatory Visit: Payer: Commercial Managed Care - PPO

## 2022-02-12 ENCOUNTER — Encounter: Payer: Commercial Managed Care - PPO | Admitting: Physical Therapy

## 2022-05-08 ENCOUNTER — Encounter (HOSPITAL_COMMUNITY): Payer: Self-pay

## 2022-05-08 ENCOUNTER — Ambulatory Visit (HOSPITAL_COMMUNITY)
Admission: EM | Admit: 2022-05-08 | Discharge: 2022-05-08 | Disposition: A | Discharge status: Home / Self Care | Payer: Commercial Managed Care - PPO | Attending: Physician Assistant | Admitting: Physician Assistant

## 2022-05-08 DIAGNOSIS — H60392 Other infective otitis externa, left ear: Secondary | ICD-10-CM

## 2022-05-08 DIAGNOSIS — J069 Acute upper respiratory infection, unspecified: Secondary | ICD-10-CM | POA: Diagnosis not present

## 2022-05-08 MED ORDER — NEOMYCIN-POLYMYXIN-HC 3.5-10000-1 OT SUSP: 4.0000 [drp] | Freq: Three times a day (TID) | OTIC | 0 refills | Status: DC

## 2022-05-08 MED ORDER — IBUPROFEN 600 MG PO TABS: 600.0000 mg | ORAL_TABLET | Freq: Three times a day (TID) | ORAL | 0 refills | Status: DC

## 2022-05-08 NOTE — ED Triage Notes (Signed)
C/o left ear pain x 2 days.

## 2022-05-08 NOTE — Discharge Instructions (Signed)
Advised take ibuprofen 600 mg 1 every 8 hours with food to help decrease the ear pain and reduce lip swelling. Advised to use the Cortisporin otic drops to help treat the ear infection on the left side. Advised to follow-up PCP or return to urgent care if symptoms fail to improve.

## 2022-05-08 NOTE — ED Provider Notes (Signed)
[facility]    CSN: [id number] Arrival date & time: 05/08/22  1458      History   Chief Complaint Chief Complaint  Patient presents with   Ear Pain    HPI Bradley Lane is a 21 y.o. male.   21 year old male presents with left ear pain and right lower lip swelling.  Patient indicates for the past 2 days he has been having persistent right ear pain, tenderness, discomfort.  Patient indicates he is not having any fever or chills, no cough, congestion, or other respiratory symptoms.  Patient also indicates that this morning he noticed that the right lower lip was swelling but he did not know exactly why.  Patient denies recent travel, and has not been around any relatives or friends that have been sick.  Patient denies recent water activities.     History reviewed. No pertinent past medical history.  Patient Active Problem List   Diagnosis Date Noted   Rupture of right patellar tendon 12/28/2021   Preventative health care 12/03/2021   Acute pain of right knee 12/03/2021    Past Surgical History:  Procedure Laterality Date   KIDNEY SURGERY     thinks there was a blockage       Home Medications    Prior to Admission medications   Medication Sig Start Date End Date Taking? Authorizing Provider  ibuprofen (ADVIL) 600 MG tablet Take 1 tablet (600 mg total) by mouth 3 (three) times daily. 05/08/22  Yes Ellsworth Lennox, PA-C  neomycin-polymyxin-hydrocortisone (CORTISPORIN) 3.5-10000-1 OTIC suspension Place 4 drops into the left ear 3 (three) times daily. 05/08/22  Yes Ellsworth Lennox, PA-C  Pseudoephedrine-APAP-DM (DAYQUIL PO) Take 5 mLs by mouth daily as needed (fever). Patient not taking: Reported on 01/12/2022    [provider]    Family History Family History  Problem Relation Age of Onset   Asthma Brother     Social History Social History   Tobacco Use   Smoking status: Never    Passive exposure: Never   Smokeless tobacco: Never  Substance Use  Topics   Alcohol use: Never   Drug use: Never     Allergies   Patient has no known allergies.   Review of Systems Review of Systems  HENT:  Positive for ear pain (left).      Physical Exam Triage Vital Signs ED Triage Vitals [05/08/22 1622]  Enc Vitals Group     BP 120/63     Pulse Rate 60     Resp 18     Temp 98.2 F (36.8 C)     Temp Source Oral     SpO2 100 %     Weight      Height      Head Circumference      Peak Flow      Pain Score      Pain Loc      Pain Edu?      Excl. in GC?    No data found.  Updated Vital Signs BP 120/63 (BP Location: Left Arm)   Pulse 60   Temp 98.2 F (36.8 C) (Oral)   Resp 18   SpO2 100%   Visual Acuity Right Eye Distance:   Left Eye Distance:   Bilateral Distance:    Right Eye Near:   Left Eye Near:    Bilateral Near:     Physical Exam Constitutional:      Appearance: Normal appearance.  HENT:     Right  Ear: Tympanic membrane and ear canal normal.     Left Ear: Tympanic membrane normal. Swelling (ear canal) present.  Cardiovascular:     Rate and Rhythm: Normal rate and regular rhythm.     Heart sounds: Normal heart sounds.  Pulmonary:     Effort: Pulmonary effort is normal.     Breath sounds: Normal breath sounds and air entry. No wheezing, rhonchi or rales.  Lymphadenopathy:     Cervical: No cervical adenopathy.  Neurological:     Mental Status: He is alert.      UC Treatments / Results  Labs (all labs ordered are listed, but only abnormal results are displayed) Labs Reviewed - No data to display  EKG   Radiology No results found.  Procedures Procedures (including critical care time)  Medications Ordered in UC Medications - No data to display  Initial Impression / Assessment and Plan / UC Course  I have reviewed the triage vital signs and the nursing notes.  Pertinent labs & imaging results that were available during my care of the patient were reviewed by me and considered in my medical  decision making (see chart for details).    Plan: 1.  Advised to take ibuprofen 600 mg every 8 hours with food to decrease the ear pain. 2.  Advised to use Cortisporin eardrops to help reduce the pain and swelling. 3.  Advised to follow-up with PCP or return to urgent care if symptoms fail to improve. Final Clinical Impressions(s) / UC Diagnoses   Final diagnoses:  Other infective acute otitis externa of left ear  Acute upper respiratory infection     Discharge Instructions      Advised take ibuprofen 600 mg 1 every 8 hours with food to help decrease the ear pain and reduce lip swelling. Advised to use the Cortisporin otic drops to help treat the ear infection on the left side. Advised to follow-up PCP or return to urgent care if symptoms fail to improve.    ED Prescriptions     Medication Sig Dispense Auth. Provider   neomycin-polymyxin-hydrocortisone (CORTISPORIN) 3.5-10000-1 OTIC suspension Place 4 drops into the left ear 3 (three) times daily. 10 mL Ellsworth Lennox, PA-C   ibuprofen (ADVIL) 600 MG tablet Take 1 tablet (600 mg total) by mouth 3 (three) times daily. 21 tablet Ellsworth Lennox, PA-C      PDMP not reviewed this encounter.   Ellsworth Lennox, PA-C 05/08/22 1656

## 2022-12-06 ENCOUNTER — Encounter: Payer: Self-pay | Admitting: Nurse Practitioner

## 2022-12-06 ENCOUNTER — Ambulatory Visit (INDEPENDENT_AMBULATORY_CARE_PROVIDER_SITE_OTHER): Payer: Medicaid Other | Admitting: Nurse Practitioner

## 2022-12-06 VITALS — BP 122/80 | HR 67 | Temp 98.2°F | Resp 16 | Ht 68.25 in | Wt 205.4 lb

## 2022-12-06 DIAGNOSIS — E669 Obesity, unspecified: Secondary | ICD-10-CM | POA: Diagnosis not present

## 2022-12-06 DIAGNOSIS — E7800 Pure hypercholesterolemia, unspecified: Secondary | ICD-10-CM | POA: Diagnosis not present

## 2022-12-06 DIAGNOSIS — Z0000 Encounter for general adult medical examination without abnormal findings: Secondary | ICD-10-CM | POA: Diagnosis not present

## 2022-12-06 LAB — LIPID PANEL
Cholesterol: 175 mg/dL (ref 0–200)
HDL: 36.8 mg/dL — ABNORMAL LOW (ref >39.00)
LDL Cholesterol: 128 mg/dL — ABNORMAL HIGH (ref 0–99)
NonHDL: 138.6
Total CHOL/HDL Ratio: 5
Triglycerides: 55 mg/dL (ref 0.0–149.0)
VLDL: 11 mg/dL (ref 0.0–40.0)

## 2022-12-06 LAB — CBC
HCT: 41.8 % (ref 39.0–52.0)
Hemoglobin: 14.2 g/dL (ref 13.0–17.0)
MCHC: 34.1 g/dL (ref 30.0–36.0)
MCV: 85.4 fl (ref 78.0–100.0)
Platelets: 313 10*3/uL (ref 150.0–400.0)
RBC: 4.9 Mil/uL (ref 4.22–5.81)
RDW: 13.9 % (ref 11.5–14.6)
WBC: 6.2 10*3/uL (ref 4.5–10.5)

## 2022-12-06 LAB — COMPREHENSIVE METABOLIC PANEL
ALT: 10 U/L (ref 0–53)
AST: 15 U/L (ref 0–37)
Albumin: 4.5 g/dL (ref 3.5–5.2)
Alkaline Phosphatase: 65 U/L (ref 39–117)
BUN: 14 mg/dL (ref 6–23)
CO2: 28 mEq/L (ref 19–32)
Calcium: 10.1 mg/dL (ref 8.4–10.5)
Chloride: 103 mEq/L (ref 96–112)
Creatinine, Ser: 0.97 mg/dL (ref 0.40–1.50)
GFR: 112.63 mL/min (ref >60.00)
Glucose, Bld: 99 mg/dL (ref 70–99)
Potassium: 3.5 mEq/L (ref 3.5–5.1)
Sodium: 139 mEq/L (ref 135–145)
Total Bilirubin: 0.3 mg/dL (ref 0.2–1.2)
Total Protein: 8 g/dL (ref 6.0–8.3)

## 2022-12-06 LAB — TSH: TSH: 2.18 u[IU]/mL (ref 0.35–5.50)

## 2022-12-06 NOTE — Assessment & Plan Note (Signed)
Patient is exercising 3 times a week plus playing basketball the weekends continue doing healthy lifestyle modifications.

## 2022-12-06 NOTE — Progress Notes (Signed)
Established Patient Office Visit  Subjective   Patient ID: Bradley Lane, male    DOB: 2000/09/28  Age: 22 y.o. MRN: [id number]  Chief Complaint  Patient presents with   Annual Exam   Labs Only    HPI  for complete physical and follow up of chronic conditions.  Immunizations: -Tetanus: Completed in 2015 -Influenza:  refused  -Shingles: Too young -Pneumonia: Too young -Covid: pfizer x 2 -HPV: up to date  Diet: [place]. States that he is doing 3 meals a day with 2-3 snacks. Drinks mostly water Exercise: No regular exercise. Goes to the gym 3 times a week (1-2 hours)and staturdays [place]  Eye exam: Completes annually. Needs updating and just glasses  Dental exam:  needs upating   Colonoscopy: Too young, Lung Cancer Screening: N/A  PSA: Too young, currently average risk  Sleep; States that he will go to bed around 12 and get up around 6. Feels rested and does not snore   Sexually Active: Not sexually acitve interested in women    Review of Systems  Constitutional:  Negative for chills and fever.  Respiratory:  Negative for shortness of breath.   Cardiovascular:  Negative for chest pain and leg swelling.  Gastrointestinal:  Negative for abdominal pain, blood in stool, constipation, diarrhea, nausea and vomiting.       Bm every other day   Genitourinary:  Negative for dysuria and hematuria.  Neurological:  Negative for tingling and headaches.  Psychiatric/Behavioral:  Negative for hallucinations and suicidal ideas.       Objective:     BP 122/80   Pulse 67   Temp 98.2 F (36.8 C)   Resp 16   Ht 5' 8.25" (1.734 m)   Wt 205 lb 6 oz (93.2 kg)   SpO2 99%   BMI 31.00 kg/m  BP Readings from Last 3 Encounters:  12/06/22 122/80  05/08/22 120/63  12/03/21 130/80   Wt Readings from Last 3 Encounters:  12/06/22 205 lb 6 oz (93.2 kg)  12/03/21 190 lb 9.6 oz (86.5 kg) (89 %, Z= 1.23)*  07/20/20 166 lb 3.6 oz (75.4 kg) (76 %, Z= 0.70)*   * Growth  percentiles are based on CDC (Boys, 2-20 Years) data.      Physical Exam Vitals and nursing note reviewed. Exam conducted with a chaperone present ([place]).  Constitutional:      Appearance: Normal appearance.  HENT:     Right Ear: Tympanic membrane, ear canal and external ear normal.     Left Ear: Tympanic membrane, ear canal and external ear normal.     Mouth/Throat:     Mouth: Mucous membranes are moist.     Pharynx: Oropharynx is clear.  Eyes:     Extraocular Movements: Extraocular movements intact.     Pupils: Pupils are equal, round, and reactive to light.  Cardiovascular:     Rate and Rhythm: Normal rate and regular rhythm.     Pulses: Normal pulses.     Heart sounds: Normal heart sounds.  Pulmonary:     Effort: Pulmonary effort is normal.     Breath sounds: Normal breath sounds.  Abdominal:     General: Bowel sounds are normal. There is no distension.     Palpations: There is no mass.     Tenderness: There is no abdominal tenderness.     Hernia: No hernia is present. There is no hernia in the left inguinal area or right inguinal area.  Genitourinary:  Penis: Normal.      Testes: Normal.     Epididymis:     Right: Normal.     Left: Normal.  Musculoskeletal:     Right lower leg: No edema.     Left lower leg: No edema.  Lymphadenopathy:     Cervical: No cervical adenopathy.     Lower Body: No right inguinal adenopathy. No left inguinal adenopathy.  Skin:    General: Skin is warm.  Neurological:     General: No focal deficit present.     Mental Status: He is alert.     Deep Tendon Reflexes:     Reflex Scores:      Bicep reflexes are 2+ on the right side and 2+ on the left side.      Patellar reflexes are 2+ on the right side and 2+ on the left side.    Comments: Bilateral upper and lower extremity strength 5/5  Psychiatric:        Mood and Affect: Mood normal.        Behavior: Behavior normal.        Thought Content: Thought content normal.         Judgment: Judgment normal.      No results found for any visits on 12/06/22.    The ASCVD Risk score (Arnett DK, et al., 2019) failed to calculate for the following reasons:   The 2019 ASCVD risk score is only valid for ages 10 to 18    Assessment & Plan:   Problem List Items Addressed This Visit       Other   Preventative health care - Primary    Discussed age-appropriate immunizations and screening exams.  Did review patient's personal, social, family, surgical histories.  Patient was given information about preventative healthcare maintenance with anticipatory guidance.  Patient is up-to-date on tetanus vaccine, HPV, COVID.  He refused flu vaccines.      Relevant Orders   CBC   Comprehensive metabolic panel   TSH   Obesity (BMI 30-39.9)    Patient is exercising 3 times a week plus playing basketball the weekends continue doing healthy lifestyle modifications.      Relevant Orders   Lipid panel   TSH   Elevated LDL cholesterol level    Previous years LDL 103.  Patient's weight has went up since last year approximately 15 pounds.  Pending labs      Relevant Orders   Lipid panel    Return in about 1 year (around 12/06/2023) for CPE and Labs.    Romilda Garret, NP

## 2022-12-06 NOTE — Assessment & Plan Note (Signed)
Previous years LDL 103.  Patient's weight has went up since last year approximately 15 pounds.  Pending labs

## 2022-12-06 NOTE — Assessment & Plan Note (Signed)
Discussed age-appropriate immunizations and screening exams.  Did review patient's personal, social, family, surgical histories.  Patient was given information about preventative healthcare maintenance with anticipatory guidance.  Patient is up-to-date on tetanus vaccine, HPV, COVID.  He refused flu vaccines.

## 2022-12-06 NOTE — Patient Instructions (Signed)
Nice to see you today I will be in touch with the labs once I have them Follow up with me in 1 year for your next physical, sooner if you need me  

## 2023-04-25 IMAGING — MR MR KNEE*R* W/O CM
6 series · 40 of 40 positions shown · non-contrast
Comparison: Right knee radiograph 12/03/2021

CLINICAL DATA: Meniscal injury, knee Xray negative Snigdha test
positive

EXAM:
MRI OF THE RIGHT KNEE WITHOUT CONTRAST
TECHNIQUE: Multiplanar, multisequence MR imaging of the knee was performed. No
intravenous contrast was administered.

[Series 6: T2 fat-sat · axial · right · 4.0mm · 0.50mm/px · z∈[-69,+84]mm · 9 of 36 slices shown (1 of 3)]
[im 1/36]
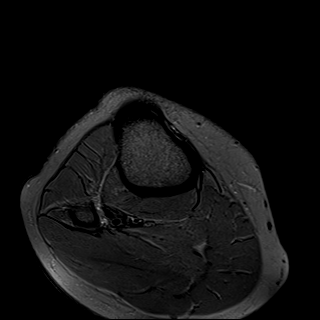
[im 5/36]
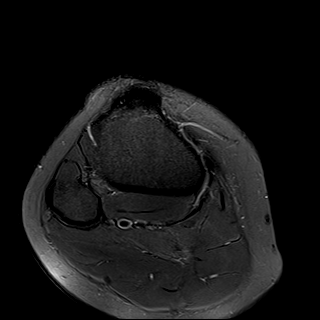
[im 9/36]
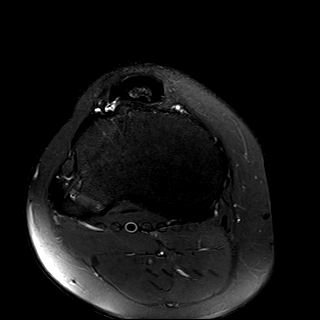
[im 14/36]
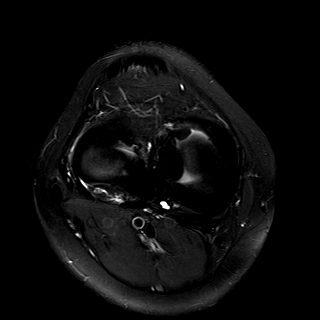
[im 18/36]
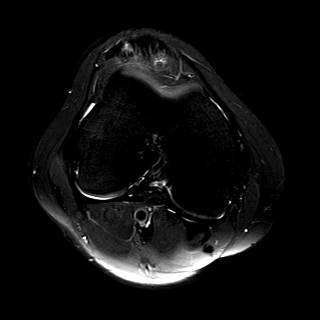
[im 22/36]
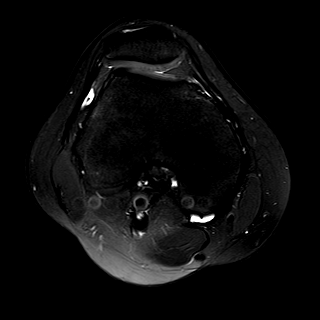
[im 27/36]
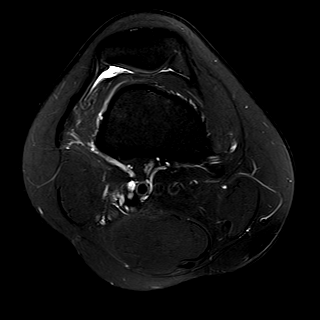
[im 31/36]
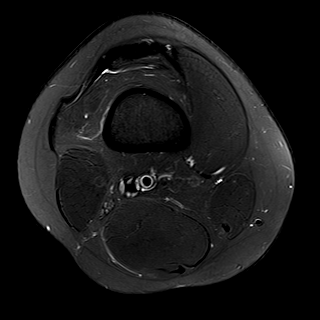
[im 36/36]
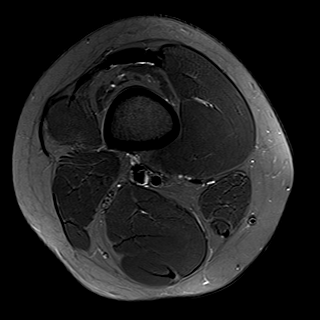

[Series 7: T2 fat-sat · coronal · right · 4.0mm · 0.47mm/px · 6 of 28 slices shown (2 of 3)]
[im 1/28]
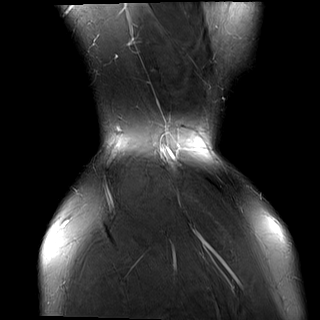
[im 6/28]
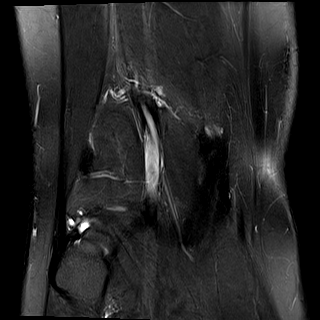
[im 11/28]
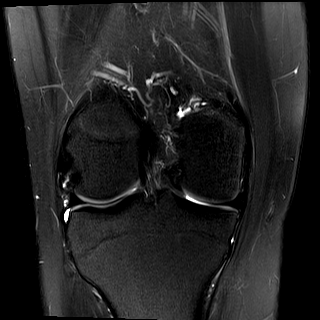
[im 17/28]
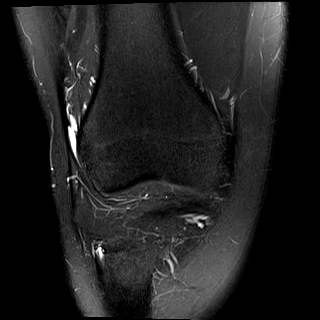
[im 22/28]
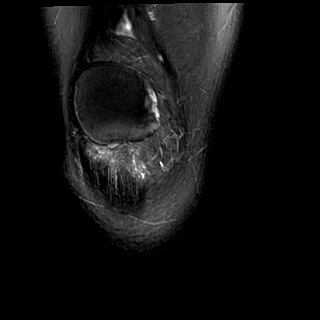
[im 28/28]
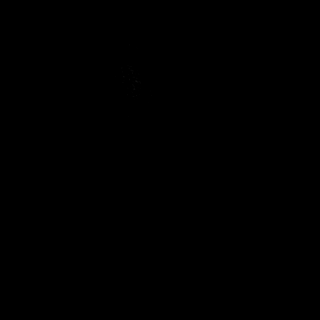

[Series 8: T1 · coronal · right · 4.0mm · 0.47mm/px · 6 of 28 slices shown]
[im 1/28]
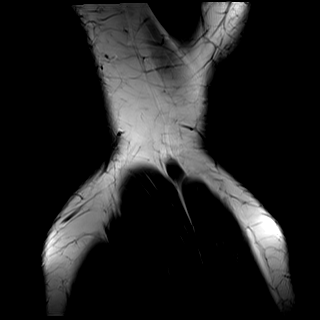
[im 6/28]
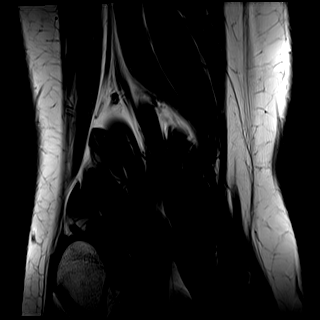
[im 11/28]
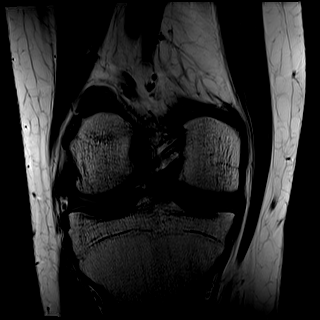
[im 17/28]
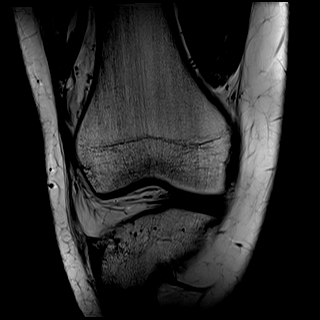
[im 22/28]
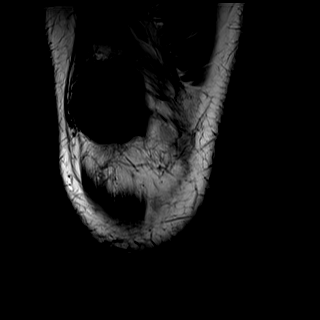
[im 28/28]
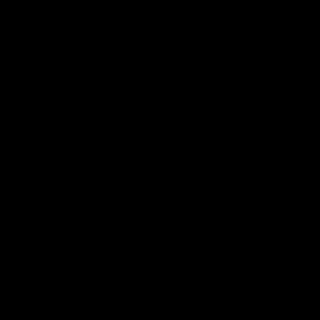

[Series 9: PD fat-sat · coronal · right · 3.0mm · 0.47mm/px · 7 of 32 slices shown (1 of 2)]
[im 1/32]
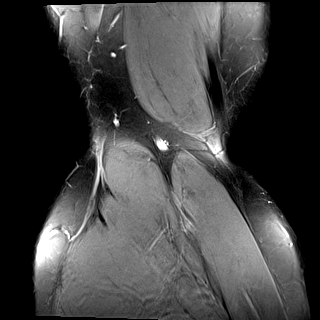
[im 6/32]
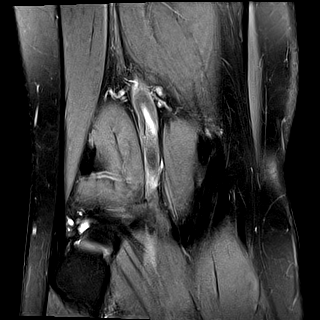
[im 11/32]
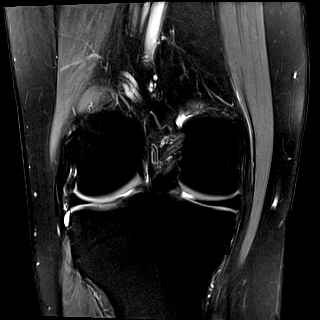
[im 16/32]
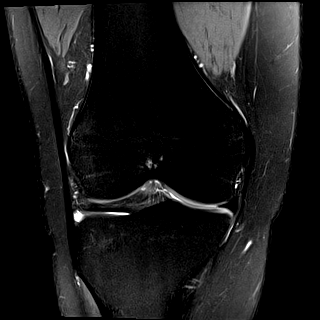
[im 21/32]
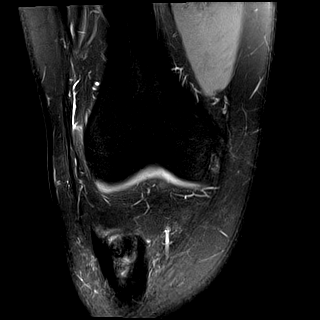
[im 26/32]
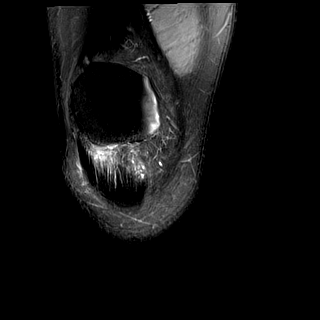
[im 32/32]
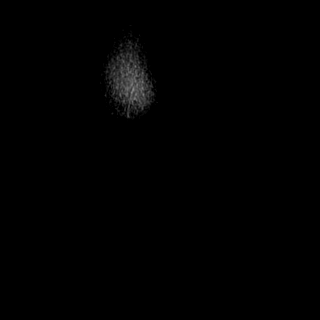

[Series 10: PD fat-sat · sagittal · right · 3.0mm · 0.39mm/px · 6 of 29 slices shown (2 of 2)]
[im 1/29]
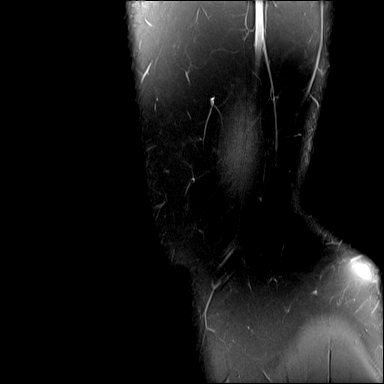
[im 6/29]
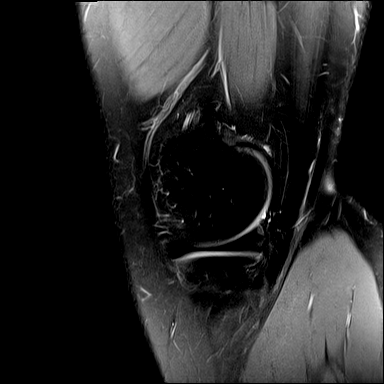
[im 12/29]
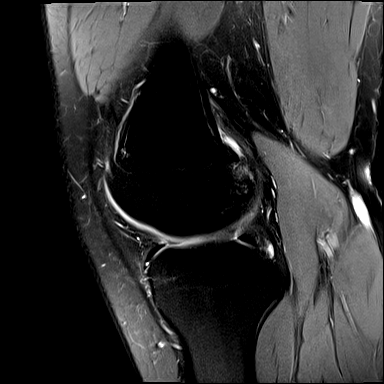
[im 17/29]
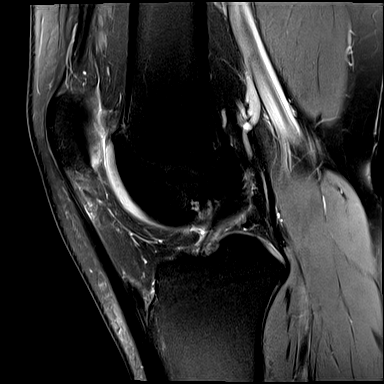
[im 23/29]
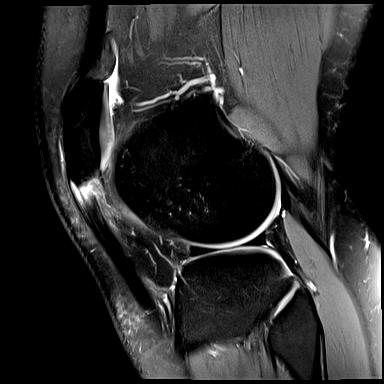
[im 29/29]
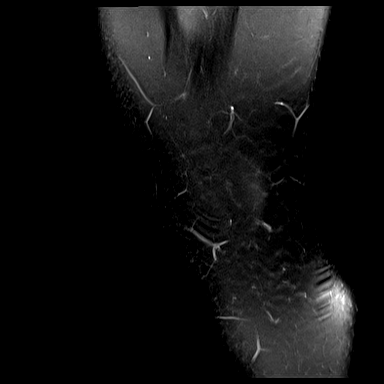

[Series 11: T2 fat-sat · sagittal · right · 3.0mm · 0.39mm/px · 6 of 29 slices shown (3 of 3)]
[im 1/29]
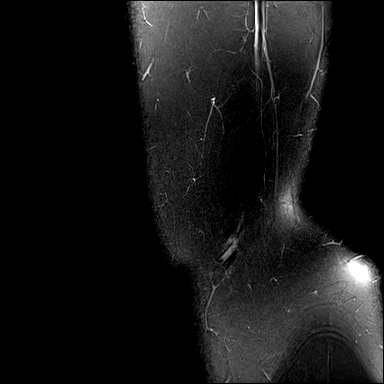
[im 6/29]
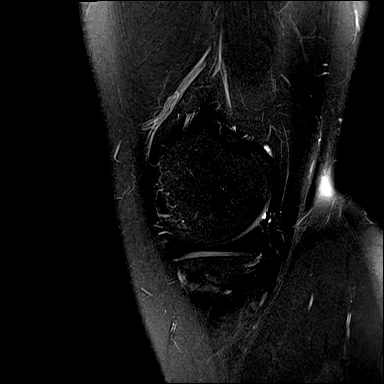
[im 12/29]
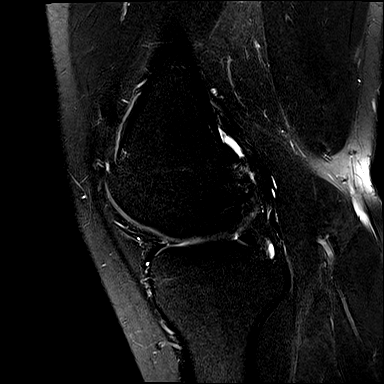
[im 17/29]
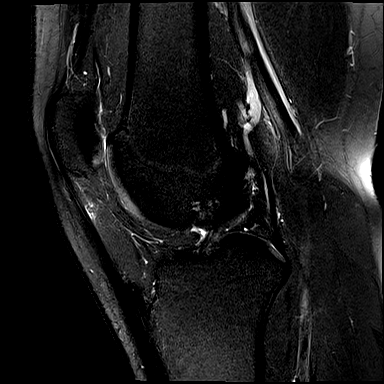
[im 23/29]
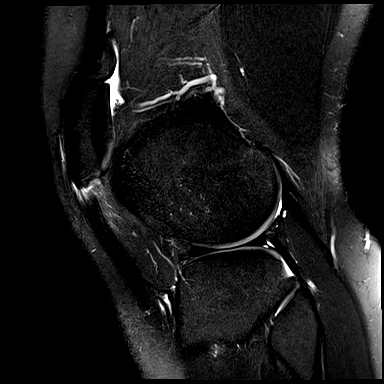
[im 29/29]
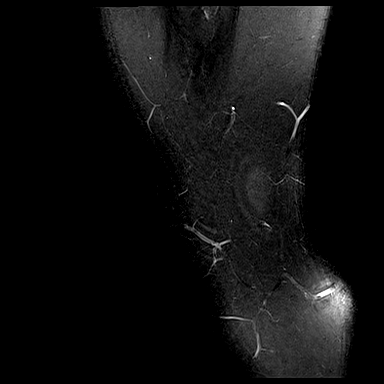

[40 of 40 positions shown; findings below may reference images not displayed]

FINDINGS: MENISCI

Medial: Intact.

Lateral: Intact.

LIGAMENTS

Cruciates: ACL and PCL are intact.

Collaterals: Medial collateral ligament is intact. Lateral
collateral ligament complex is intact.

CARTILAGE

Patellofemoral: There is low-grade chondral fissuring along the
medial patellar facet (axial T2 images 12-13).

Medial:  No chondral defect.

Lateral:  No chondral defect.

JOINT: No significant joint effusion.

POPLITEAL FOSSA: No significant Baker cyst.

EXTENSOR MECHANISM: There is a small, partial width high-grade tear
involving the lateral aspect of the patellar tendon near the
proximal attachment on the patella (axial T2 image 18, sagittal T2
images 20 2-23). Low-grade tendinosis or interstitial tearing
medially. The majority of the patellar tendon is intact. Adjacent
edema in Hoffa's fat pad.

BONES: Bony protuberance at the tibial tuberosity consistent with
prior Osgood-Schlatter.

Other: No fluid collection or hematoma. Muscles are normal.
IMPRESSION: Small, partial width, high-grade insertional tear involving the
lateral aspect of the patellar tendon at the proximal attachment on
the patella. Low-grade tendinosis or interstitial tearing medially.

No evidence of meniscus tear. Intact cruciate and collateral
ligaments.

Low-grade chondral fissuring along the medial patellar facet.

## 2023-10-10 ENCOUNTER — Emergency Department (HOSPITAL_COMMUNITY): Payer: Medicaid Other

## 2023-10-10 ENCOUNTER — Emergency Department (HOSPITAL_COMMUNITY)
Admission: EM | Admit: 2023-10-10 | Discharge: 2023-10-10 | Disposition: A | Discharge status: Home / Self Care | Payer: Medicaid Other | Attending: Emergency Medicine | Admitting: Emergency Medicine

## 2023-10-10 ENCOUNTER — Other Ambulatory Visit: Payer: Self-pay

## 2023-10-10 ENCOUNTER — Emergency Department (HOSPITAL_COMMUNITY)
Admission: EM | Admit: 2023-10-10 | Discharge: 2023-10-11 | Disposition: A | Discharge status: Home / Self Care | Payer: Medicaid Other | Source: Home / Self Care | Attending: Emergency Medicine | Admitting: Emergency Medicine

## 2023-10-10 ENCOUNTER — Encounter (HOSPITAL_COMMUNITY): Payer: Self-pay

## 2023-10-10 DIAGNOSIS — R339 Retention of urine, unspecified: Secondary | ICD-10-CM | POA: Insufficient documentation

## 2023-10-10 DIAGNOSIS — R300 Dysuria: Secondary | ICD-10-CM | POA: Insufficient documentation

## 2023-10-10 DIAGNOSIS — N134 Hydroureter: Secondary | ICD-10-CM | POA: Diagnosis not present

## 2023-10-10 DIAGNOSIS — N200 Calculus of kidney: Secondary | ICD-10-CM

## 2023-10-10 DIAGNOSIS — R39198 Other difficulties with micturition: Secondary | ICD-10-CM

## 2023-10-10 DIAGNOSIS — R109 Unspecified abdominal pain: Secondary | ICD-10-CM | POA: Diagnosis not present

## 2023-10-10 DIAGNOSIS — R1011 Right upper quadrant pain: Secondary | ICD-10-CM | POA: Diagnosis present

## 2023-10-10 DIAGNOSIS — N132 Hydronephrosis with renal and ureteral calculous obstruction: Secondary | ICD-10-CM | POA: Insufficient documentation

## 2023-10-10 LAB — COMPREHENSIVE METABOLIC PANEL
ALT: 12 U/L (ref 0–44)
ALT: 12 U/L (ref 0–44)
AST: 14 U/L — ABNORMAL LOW (ref 15–41)
AST: 17 U/L (ref 15–41)
Albumin: 4 g/dL (ref 3.5–5.0)
Albumin: 4.5 g/dL (ref 3.5–5.0)
Alkaline Phosphatase: 51 U/L (ref 38–126)
Alkaline Phosphatase: 59 U/L (ref 38–126)
Anion gap: 8 (ref 5–15)
Anion gap: 8 (ref 5–15)
BUN: 12 mg/dL (ref 6–20)
BUN: 14 mg/dL (ref 6–20)
CO2: 24 mmol/L (ref 22–32)
CO2: 25 mmol/L (ref 22–32)
Calcium: 9.7 mg/dL (ref 8.9–10.3)
Calcium: 9.6 mg/dL (ref 8.9–10.3)
Chloride: 106 mmol/L (ref 98–111)
Chloride: 103 mmol/L (ref 98–111)
Creatinine, Ser: 1.1 mg/dL (ref 0.61–1.24)
Creatinine, Ser: 1.54 mg/dL — ABNORMAL HIGH (ref 0.61–1.24)
GFR, Estimated: >60 mL/min (ref >60)
GFR, Estimated: >60 mL/min (ref >60)
Glucose, Bld: 116 mg/dL — ABNORMAL HIGH (ref 70–99)
Glucose, Bld: 152 mg/dL — ABNORMAL HIGH (ref 70–99)
Potassium: 3.9 mmol/L (ref 3.5–5.1)
Potassium: 4.2 mmol/L (ref 3.5–5.1)
Sodium: 138 mmol/L (ref 135–145)
Sodium: 136 mmol/L (ref 135–145)
Total Bilirubin: 0.6 mg/dL (ref 0.0–1.2)
Total Bilirubin: 0.6 mg/dL (ref 0.0–1.2)
Total Protein: 7.3 g/dL (ref 6.5–8.1)
Total Protein: 7.6 g/dL (ref 6.5–8.1)

## 2023-10-10 LAB — CBC
HCT: 43.3 % (ref 39.0–52.0)
Hemoglobin: 14.5 g/dL (ref 13.0–17.0)
MCH: 28.8 pg (ref 26.0–34.0)
MCHC: 33.5 g/dL (ref 30.0–36.0)
MCV: 85.9 fL (ref 80.0–100.0)
Platelets: 274 10*3/uL (ref 150–400)
RBC: 5.04 MIL/uL (ref 4.22–5.81)
RDW: 13 % (ref 11.5–15.5)
WBC: 6.2 10*3/uL (ref 4.0–10.5)
nRBC: 0 % (ref 0.0–0.2)

## 2023-10-10 LAB — CBC WITH DIFFERENTIAL/PLATELET
Abs Immature Granulocytes: 0.04 10*3/uL (ref 0.00–0.07)
Basophils Absolute: 0 10*3/uL (ref 0.0–0.1)
Basophils Relative: 0 %
Eosinophils Absolute: 0 10*3/uL (ref 0.0–0.5)
Eosinophils Relative: 0 %
HCT: 42.6 % (ref 39.0–52.0)
Hemoglobin: 14.2 g/dL (ref 13.0–17.0)
Immature Granulocytes: 0 %
Lymphocytes Relative: 8 %
Lymphs Abs: 0.8 10*3/uL (ref 0.7–4.0)
MCH: 29 pg (ref 26.0–34.0)
MCHC: 33.3 g/dL (ref 30.0–36.0)
MCV: 87.1 fL (ref 80.0–100.0)
Monocytes Absolute: 0.5 10*3/uL (ref 0.1–1.0)
Monocytes Relative: 5 %
Neutro Abs: 8.5 10*3/uL — ABNORMAL HIGH (ref 1.7–7.7)
Neutrophils Relative %: 87 %
Platelets: 276 10*3/uL (ref 150–400)
RBC: 4.89 MIL/uL (ref 4.22–5.81)
RDW: 12.9 % (ref 11.5–15.5)
WBC: 9.9 10*3/uL (ref 4.0–10.5)
nRBC: 0 % (ref 0.0–0.2)

## 2023-10-10 LAB — URINALYSIS, ROUTINE W REFLEX MICROSCOPIC
Bacteria, UA: NONE SEEN
Bilirubin Urine: NEGATIVE
Glucose, UA: NEGATIVE mg/dL
Ketones, ur: NEGATIVE mg/dL
Leukocytes,Ua: NEGATIVE
Nitrite: NEGATIVE
Protein, ur: NEGATIVE mg/dL
RBC / HPF: >50 RBC/hpf (ref 0–5)
Specific Gravity, Urine: 1.014 (ref 1.005–1.030)
pH: 6 (ref 5.0–8.0)

## 2023-10-10 LAB — LIPASE, BLOOD: Lipase: 26 U/L (ref 11–51)

## 2023-10-10 MED ORDER — KETOROLAC TROMETHAMINE 60 MG/2ML IM SOLN
30.0000 mg | Freq: Once | INTRAMUSCULAR | Status: AC
  Administered 2023-10-10: 30 mg via INTRAMUSCULAR
  Filled 2023-10-10: qty 2

## 2023-10-10 MED ORDER — ONDANSETRON 4 MG PO TBDP: 4.0000 mg | ORAL_TABLET | Freq: Three times a day (TID) | ORAL | 0 refills | Status: DC | PRN

## 2023-10-10 MED ORDER — TAMSULOSIN HCL 0.4 MG PO CAPS: 0.4000 mg | ORAL_CAPSULE | Freq: Every day | ORAL | 0 refills | Status: AC

## 2023-10-10 MED ORDER — HYDROCODONE-ACETAMINOPHEN 5-325 MG PO TABS: 1.0000 | ORAL_TABLET | ORAL | 0 refills | Status: DC | PRN

## 2023-10-10 NOTE — Discharge Instructions (Addendum)
Return if any problems.  Schedule to see the Urologist for recheck

## 2023-10-10 NOTE — ED Provider Notes (Signed)
[place] EMERGENCY DEPARTMENT AT [facility] Provider Note   CSN: [id number] Arrival date & time: 10/10/23  4782     History  Chief Complaint  Patient presents with   Abdominal Pain    Bradley Lane is a 23 y.o. male.  Patient complains of patient denies any medical problems.  He has not had any fever or chills.  He denies any nausea or vomiting.  Patient denies any cough or congestion.  Patient is not currently on any medications he is not taking anything for pain.  Patient reports minimal pain currently and does not want anything for pain  The history is provided by the patient and a parent.  Abdominal Pain Pain location:  RUQ and R flank Pain quality: sharp   Pain radiates to:  R flank Pain severity:  Severe Onset quality:  Sudden Timing:  Constant Progression:  Worsening Chronicity:  New Context: not recent illness   Relieved by:  Nothing Worsened by:  Nothing Ineffective treatments:  None tried Associated symptoms: no dysuria, no fever and no nausea   Risk factors: no alcohol abuse and has not had multiple surgeries        Home Medications Prior to Admission medications   Medication Sig Start Date End Date Taking? Authorizing Provider  ibuprofen (ADVIL) 600 MG tablet Take 1 tablet (600 mg total) by mouth 3 (three) times daily. Patient not taking: Reported on 12/06/2022 05/08/22   Ellsworth Lennox, PA-C  neomycin-polymyxin-hydrocortisone (CORTISPORIN) 3.5-10000-1 OTIC suspension Place 4 drops into the left ear 3 (three) times daily. Patient not taking: Reported on 12/06/2022 05/08/22   Ellsworth Lennox, PA-C  Pseudoephedrine-APAP-DM (DAYQUIL PO) Take 5 mLs by mouth daily as needed (fever). Patient not taking: Reported on 01/12/2022    [provider]      Allergies    Patient has no known allergies.    Review of Systems   Review of Systems  Constitutional:  Negative for fever.  Gastrointestinal:  Positive for abdominal pain. Negative for nausea.   Genitourinary:  Negative for dysuria.  All other systems reviewed and are negative.   Physical Exam Updated Vital Signs BP 125/74 (BP Location: Right Arm)   Pulse 61   Temp 98.3 F (36.8 C) (Oral)   Resp 18   Ht 5\' 8"  (1.727 m)   Wt 93 kg   SpO2 100%   BMI 31.17 kg/m  Physical Exam Vitals and nursing note reviewed.  Constitutional:      General: He is not in acute distress.    Appearance: He is well-developed.  HENT:     Head: Normocephalic and atraumatic.     Mouth/Throat:     Mouth: Mucous membranes are moist.     Pharynx: Oropharynx is clear.  Eyes:     Extraocular Movements: Extraocular movements intact.     Conjunctiva/sclera: Conjunctivae normal.     Pupils: Pupils are equal, round, and reactive to light.  Cardiovascular:     Rate and Rhythm: Normal rate and regular rhythm.     Heart sounds: No murmur heard. Pulmonary:     Effort: Pulmonary effort is normal. No respiratory distress.     Breath sounds: Normal breath sounds.  Abdominal:     General: Abdomen is flat. Bowel sounds are normal.     Palpations: Abdomen is soft.     Tenderness: There is no abdominal tenderness.  Musculoskeletal:        General: No swelling.     Cervical back: Neck supple.  Skin:    General: Skin is warm and dry.     Capillary Refill: Capillary refill takes less than 2 seconds.  Neurological:     Mental Status: He is alert.  Psychiatric:        Mood and Affect: Mood normal.     ED Results / Procedures / Treatments   Labs (all labs ordered are listed, but only abnormal results are displayed) Labs Reviewed  COMPREHENSIVE METABOLIC PANEL - Abnormal; Notable for the following components:      Result Value   Glucose, Bld 116 (*)    AST 14 (*)    All other components within normal limits  URINALYSIS, ROUTINE W REFLEX MICROSCOPIC - Abnormal; Notable for the following components:   Hgb urine dipstick LARGE (*)    All other components within normal limits  LIPASE, BLOOD  CBC     EKG None  Radiology CT Renal Stone Study Result Date: 10/10/2023 CLINICAL DATA:  Acute right abdominal/flank pain, stone 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 RADIATION DOSE REDUCTION: This exam was performed according to the departmental dose-optimization program which includes automated exposure control, adjustment of the mA and/or kV according to patient size and/or use of iterative reconstruction technique. COMPARISON:  None Available. FINDINGS: Lower chest: No acute abnormality. Hepatobiliary: Limited without IV contrast. No large focal hepatic abnormality. Gallbladder nondistended. Common bile duct nondilated. Pancreas: Unremarkable. No pancreatic ductal dilatation or surrounding inflammatory changes. Spleen: Normal in size without focal abnormality. Adrenals/Urinary Tract: Normal adrenal glands. Right kidney demonstrates mild hydronephrosis and proximal hydroureter secondary to a 3 mm right mid ureteral obstructing calculus, image 41/3. No additional right urinary tract calculi. Left kidney is slightly lower/malposition in the left lower abdomen with associated left hydroureteronephrosis but without visualized radiopaque obstructing nephrolithiasis. 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 There is a punctate 2 mm nonobstructing left intrarenal calculus also noted in the upper pole. Stomach/Bowel: Stomach is within normal limits. Appendix appears normal. No evidence of bowel wall thickening, distention, or inflammatory changes. Vascular/Lymphatic: Limited without IV contrast. Aorta normal in caliber. No aneurysm. Retroperitoneum or hematoma. No bulky adenopathy. Reproductive: No significant finding by CT. Other: No abdominal wall hernia or abnormality. No abdominopelvic  ascites. Musculoskeletal: No acute or significant osseous findings. IMPRESSION: 3 mm mildly obstructing right mid ureteral calculus with associated proximal right hydroureteronephrosis. Additional punctate nonobstructing left nephrolithiasis Slightly lower/malposition left kidney with mild left hydroureteronephrosis noted but without obstructing nephrolithiasis. 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 Electronically Signed   By: Judie Petit.  Shick M.D.   On: 10/10/2023 08:29    Procedures Procedures    Medications Ordered in ED Medications  ketorolac (TORADOL) injection 30 mg (has no administration in time range)    ED Course/ Medical Decision Making/ A&P                                 Medical Decision Making Patient complains of right flank pain.  Patient reports pain began before coming to the emergency department.  Patient denies any injury.  He has not had any fever or chills  Amount and/or Complexity of Data Reviewed Independent Historian: parent    Details: Is here with a family member  who is supportive Labs: ordered. Decision-making details documented in ED Course.    Details: Labs ordered reviewed and interpreted urinalysis shows blood Radiology: ordered and independent interpretation performed. Decision-making details documented in ED Course.    Details: CT scan shows a 3 mm mildly obstructing right mid ureteral calculus with proximal right hydronephrosis  Risk Prescription drug management. Risk Details: Patient is counseled on results.  He is given an injection of Toradol for discomfort.  Patient is advised to schedule follow-up with urology.  He is given a prescription for Flomax hydrocodone and Zofran for discomfort.  He is advised to return to the emergency department if symptoms become worse or change.           Final Clinical Impression(s) / ED Diagnoses Final diagnoses:  Right kidney stone    Rx / DC Orders ED  Discharge Orders          Ordered    HYDROcodone-acetaminophen (NORCO/VICODIN) 5-325 MG tablet  Every 4 hours PRN        10/10/23 1015    tamsulosin (FLOMAX) 0.4 MG CAPS capsule  Daily        10/10/23 1015    ondansetron (ZOFRAN-ODT) 4 MG disintegrating tablet  Every 8 hours PRN        10/10/23 1015           An After Visit Summary was printed and given to the patient.    Osie Cheeks 10/10/23 1225    Lorre Nick, MD 10/12/23 [phone]

## 2023-10-10 NOTE — ED Triage Notes (Signed)
Pt arrives with c/o urinary retention. Per pt, he has not urinated since 12pm. Pt was recently diagnosed with a kidney stone.

## 2023-10-10 NOTE — ED Notes (Signed)
Bladder scan shows 135-163ml of urine in the bladder

## 2023-10-10 NOTE — ED Triage Notes (Signed)
Pt c/o right sided abdominal pain that started as soon as he woke up. Pt denies nausea, vomiting, diarrhea, constipation or urinary symptoms.

## 2023-10-11 MED ORDER — TAMSULOSIN HCL 0.4 MG PO CAPS
0.4000 mg | ORAL_CAPSULE | Freq: Once | ORAL | Status: AC
  Administered 2023-10-11: 0.4 mg via ORAL
  Filled 2023-10-11: qty 1

## 2023-10-11 MED ORDER — KETOROLAC TROMETHAMINE 30 MG/ML IJ SOLN
30.0000 mg | Freq: Once | INTRAMUSCULAR | Status: AC
  Administered 2023-10-11: 30 mg via INTRAMUSCULAR
  Filled 2023-10-11: qty 1

## 2023-10-11 MED ORDER — OXYCODONE-ACETAMINOPHEN 5-325 MG PO TABS
1.0000 | ORAL_TABLET | Freq: Once | ORAL | Status: AC
  Administered 2023-10-11: 1 via ORAL
  Filled 2023-10-11: qty 1

## 2023-10-11 NOTE — ED Provider Notes (Signed)
[place] EMERGENCY DEPARTMENT AT [facility] Provider Note   CSN: [id number] Arrival date & time: 10/10/23  2017     History  Chief Complaint  Patient presents with   Urinary Retention    Bradley Lane is a 23 y.o. male.  The history is provided by the patient and medical records.   23 y.o. M here with concern of urinary retention.  Seen earlier in the day and diagnosed with kidney stone.  States has not urinated since noon.  He reports drinking 4 bottles of water today.  Does not feel the urge to urinate.  Vomited x1 in the lobby but denies ongoing nausea now.  No fever/chills.  Home Medications Prior to Admission medications   Medication Sig Start Date End Date Taking? Authorizing Provider  HYDROcodone-acetaminophen (NORCO/VICODIN) 5-325 MG tablet Take 1 tablet by mouth every 4 (four) hours as needed for moderate pain (pain score 4-6). 10/10/23 10/09/24  Elson Areas, PA-C  ondansetron (ZOFRAN-ODT) 4 MG disintegrating tablet Take 1 tablet (4 mg total) by mouth every 8 (eight) hours as needed for nausea or vomiting. 10/10/23   Elson Areas, PA-C  tamsulosin (FLOMAX) 0.4 MG CAPS capsule Take 1 capsule (0.4 mg total) by mouth daily for 7 days. 10/10/23 10/17/23  Elson Areas, PA-C      Allergies    Patient has no known allergies.    Review of Systems   Review of Systems  Genitourinary:  Positive for difficulty urinating.  All other systems reviewed and are negative.   Physical Exam Updated Vital Signs BP 137/68 (BP Location: Right Arm)   Pulse 88   Temp 98 F (36.7 C)   Resp 16   Wt 93 kg   SpO2 100%   BMI 31.17 kg/m   Physical Exam Vitals and nursing note reviewed.  Constitutional:      Appearance: He is well-developed.  HENT:     Head: Normocephalic and atraumatic.  Eyes:     Conjunctiva/sclera: Conjunctivae normal.     Pupils: Pupils are equal, round, and reactive to light.  Cardiovascular:     Rate and Rhythm: Normal rate and regular  rhythm.     Heart sounds: Normal heart sounds.  Pulmonary:     Effort: Pulmonary effort is normal. No respiratory distress.     Breath sounds: Normal breath sounds. No rhonchi.  Abdominal:     General: Bowel sounds are normal.     Palpations: Abdomen is soft.     Tenderness: There is no abdominal tenderness. There is no rebound.  Musculoskeletal:        General: Normal range of motion.     Cervical back: Normal range of motion.  Skin:    General: Skin is warm and dry.  Neurological:     Mental Status: He is alert and oriented to person, place, and time.     ED Results / Procedures / Treatments   Labs (all labs ordered are listed, but only abnormal results are displayed) Labs Reviewed  CBC WITH DIFFERENTIAL/PLATELET - Abnormal; Notable for the following components:      Result Value   Neutro Abs 8.5 (*)    All other components within normal limits  COMPREHENSIVE METABOLIC PANEL - Abnormal; Notable for the following components:   Glucose, Bld 152 (*)    Creatinine, Ser 1.54 (*)    All other components within normal limits    EKG None  Radiology CT Renal Stone Study Result Date: 10/10/2023 CLINICAL  DATA:  Acute right abdominal/flank pain, stone 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 RADIATION DOSE REDUCTION: This exam was performed according to the departmental dose-optimization program which includes automated exposure control, adjustment of the mA and/or kV according to patient size and/or use of iterative reconstruction technique. COMPARISON:  None Available. FINDINGS: Lower chest: No acute abnormality. Hepatobiliary: Limited without IV contrast. No large focal hepatic abnormality. Gallbladder nondistended. Common bile duct nondilated. Pancreas: Unremarkable. No pancreatic ductal dilatation or  surrounding inflammatory changes. Spleen: Normal in size without focal abnormality. Adrenals/Urinary Tract: Normal adrenal glands. Right kidney demonstrates mild hydronephrosis and proximal hydroureter secondary to a 3 mm right mid ureteral obstructing calculus, image 41/3. No additional right urinary tract calculi. Left kidney is slightly lower/malposition in the left lower abdomen with associated left hydroureteronephrosis but without visualized radiopaque obstructing nephrolithiasis. 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 There is a punctate 2 mm nonobstructing left intrarenal calculus also noted in the upper pole. Stomach/Bowel: Stomach is within normal limits. Appendix appears normal. No evidence of bowel wall thickening, distention, or inflammatory changes. Vascular/Lymphatic: Limited without IV contrast. Aorta normal in caliber. No aneurysm. Retroperitoneum or hematoma. No bulky adenopathy. Reproductive: No significant finding by CT. Other: No abdominal wall hernia or abnormality. No abdominopelvic ascites. Musculoskeletal: No acute or significant osseous findings. IMPRESSION: 3 mm mildly obstructing right mid ureteral calculus with associated proximal right hydroureteronephrosis. Additional punctate nonobstructing left nephrolithiasis Slightly lower/malposition left kidney with mild left hydroureteronephrosis noted but without obstructing nephrolithiasis. 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 Electronically Signed   By: Judie Petit.  Shick M.D.   On: 10/10/2023 08:29    Procedures Procedures    Medications Ordered in ED Medications - No data to display  ED Course/ Medical Decision Making/ A&P                                 Medical Decision Making Amount and/or Complexity of Data Reviewed ECG/medicine tests: ordered and independent interpretation performed.  Risk Prescription drug management.   23 year old  male presenting to the ED with difficulty urinating.  Seen in the ED yesterday diagnosed with a kidney stone.  States he has drank several bottles of water, still has not urinated.  Still having some pain.  Did not take Flomax yet.  Labs were repeated, remains without leukocytosis.  Renal function is still normal.  Bladder scan with <200cc  retained.  Suspect he likely is having some spasm secondary to stone.  Given medications here, plan for voiding trial.  Patient was able to urinate here.  I do not feel he needs Foley catheter.  Stable for discharge.  Will need to continue Flomax and pain control.  Given urology follow-up if needed.  Return here for new concerns.  Final Clinical Impression(s) / ED Diagnoses Final diagnoses:  Difficulty urinating    Rx / DC Orders ED Discharge Orders     None         Garlon Hatchet, PA-C 10/11/23 0432    Tilden Fossa, MD 10/11/23 [phone]

## 2023-10-11 NOTE — Discharge Instructions (Signed)
Continue medications at home.  This should help with urination.  Not uncommon to have some trouble with this with kidney stone present. Can follow-up with urology if ongoing issues. Return here for new concerns.

## 2023-10-11 NOTE — ED Notes (Signed)
This RN reviewed discharge instructions with patient. he verbalized understanding and denied any further questions. PT well appearing upon discharge and reports tolerable pain. Pt ambulated with stable gait to exit. Pt endorses ride home with family.

## 2023-10-12 DIAGNOSIS — N202 Calculus of kidney with calculus of ureter: Secondary | ICD-10-CM | POA: Diagnosis not present

## 2023-10-12 DIAGNOSIS — N130 Hydronephrosis with ureteropelvic junction obstruction: Secondary | ICD-10-CM | POA: Diagnosis not present

## 2023-10-26 DIAGNOSIS — N202 Calculus of kidney with calculus of ureter: Secondary | ICD-10-CM | POA: Diagnosis not present

## 2023-12-07 ENCOUNTER — Encounter: Payer: Self-pay | Admitting: Nurse Practitioner

## 2023-12-07 ENCOUNTER — Ambulatory Visit (INDEPENDENT_AMBULATORY_CARE_PROVIDER_SITE_OTHER): Payer: Medicaid Other | Admitting: Nurse Practitioner

## 2023-12-07 VITALS — BP 110/74 | HR 60 | Temp 97.9°F | Ht 68.25 in | Wt 210.0 lb

## 2023-12-07 DIAGNOSIS — Z6831 Body mass index (BMI) 31.0-31.9, adult: Secondary | ICD-10-CM | POA: Diagnosis not present

## 2023-12-07 DIAGNOSIS — Z114 Encounter for screening for human immunodeficiency virus [HIV]: Secondary | ICD-10-CM | POA: Diagnosis not present

## 2023-12-07 DIAGNOSIS — Z1159 Encounter for screening for other viral diseases: Secondary | ICD-10-CM | POA: Diagnosis not present

## 2023-12-07 DIAGNOSIS — E7800 Pure hypercholesterolemia, unspecified: Secondary | ICD-10-CM | POA: Diagnosis not present

## 2023-12-07 DIAGNOSIS — E669 Obesity, unspecified: Secondary | ICD-10-CM

## 2023-12-07 DIAGNOSIS — Z0000 Encounter for general adult medical examination without abnormal findings: Secondary | ICD-10-CM | POA: Diagnosis not present

## 2023-12-07 DIAGNOSIS — Z131 Encounter for screening for diabetes mellitus: Secondary | ICD-10-CM

## 2023-12-07 LAB — COMPREHENSIVE METABOLIC PANEL
ALT: 11 U/L (ref 0–53)
AST: 13 U/L (ref 0–37)
Albumin: 4.8 g/dL (ref 3.5–5.2)
Alkaline Phosphatase: 58 U/L (ref 39–117)
BUN: 13 mg/dL (ref 6–23)
CO2: 30 meq/L (ref 19–32)
Calcium: 9.8 mg/dL (ref 8.4–10.5)
Chloride: 102 meq/L (ref 96–112)
Creatinine, Ser: 1.01 mg/dL (ref 0.40–1.50)
GFR: 106.55 mL/min (ref >60.00)
Glucose, Bld: 98 mg/dL (ref 70–99)
Potassium: 4.5 meq/L (ref 3.5–5.1)
Sodium: 138 meq/L (ref 135–145)
Total Bilirubin: 0.4 mg/dL (ref 0.2–1.2)
Total Protein: 7.5 g/dL (ref 6.0–8.3)

## 2023-12-07 LAB — HEMOGLOBIN A1C: Hgb A1c MFr Bld: 5.5 % (ref 4.6–6.5)

## 2023-12-07 LAB — CBC
HCT: 42.8 % (ref 39.0–52.0)
Hemoglobin: 14.3 g/dL (ref 13.0–17.0)
MCHC: 33.5 g/dL (ref 30.0–36.0)
MCV: 86.6 fl (ref 78.0–100.0)
Platelets: 266 10*3/uL (ref 150.0–400.0)
RBC: 4.95 Mil/uL (ref 4.22–5.81)
RDW: 14.3 % (ref 11.5–15.5)
WBC: 4.2 10*3/uL (ref 4.0–10.5)

## 2023-12-07 LAB — LIPID PANEL
Cholesterol: 187 mg/dL (ref 0–200)
HDL: 39.2 mg/dL (ref >39.00)
LDL Cholesterol: 141 mg/dL — ABNORMAL HIGH (ref 0–99)
NonHDL: 147.64
Total CHOL/HDL Ratio: 5
Triglycerides: 34 mg/dL (ref 0.0–149.0)
VLDL: 6.8 mg/dL (ref 0.0–40.0)

## 2023-12-07 LAB — TSH: TSH: 1.4 u[IU]/mL (ref 0.35–5.50)

## 2023-12-07 NOTE — Assessment & Plan Note (Signed)
 Labs pending

## 2023-12-07 NOTE — Progress Notes (Signed)
 Established Patient Office Visit  Subjective   Patient ID: Bradley Lane, male    DOB: [birth year]  Age: 23 y.o. MRN: [id number]  Chief Complaint  Patient presents with   Annual Exam    HPI  for complete physical and follow up of chronic conditions.   Nephrolithiasis: Patient was recently Emergency Department diagnosed with kidney stone.  Patient states she did pass kidney stone he did follow-up with [facility].  They will see him in 1 year.  Immunizations: -Tetanus: Completed in 2015, update at local health department/pharmacy  -Influenza: update at local pharmacy/health department  -Shingles: too young  -Pneumonia: too young HPV: completed Covid: original and booster  Diet: Fair diet. 2-3 meals a day with unhealthy snacks. He drinks some water, juice and the occasional soda Exercise: weights twice a week at with recreational basketball on the weekend   Eye exam: PRN, wears glasses Dental exam: Needs updating     Colonoscopy: too young   Lung Cancer Screening: NA  PSA: Too young  STI: declines testing. Not sexually active       Review of Systems  Constitutional:  Negative for chills and fever.  Respiratory:  Negative for shortness of breath.   Cardiovascular:  Negative for chest pain and leg swelling.  Gastrointestinal:  Negative for abdominal pain, blood in stool, constipation, diarrhea, nausea and vomiting.       BM daily   Genitourinary:  Negative for dysuria and hematuria.  Neurological:  Negative for tingling and headaches.  Psychiatric/Behavioral:  Negative for hallucinations and suicidal ideas.       Objective:     BP 110/74   Pulse 60   Temp 97.9 F (36.6 C) (Oral)   Ht 5' 8.25" (1.734 m)   Wt 210 lb (95.3 kg)   SpO2 99%   BMI 31.70 kg/m  BP Readings from Last 3 Encounters:  12/07/23 110/74  10/11/23 137/80  10/10/23 125/74   Wt Readings from Last 3 Encounters:  12/07/23 210 lb (95.3 kg)  10/10/23 205 lb (93 kg)   10/10/23 205 lb (93 kg)   SpO2 Readings from Last 3 Encounters:  12/07/23 99%  10/11/23 100%  10/10/23 100%      Physical Exam Vitals and nursing note reviewed.  Constitutional:      Appearance: Normal appearance.  HENT:     Right Ear: Tympanic membrane, ear canal and external ear normal.     Left Ear: Tympanic membrane, ear canal and external ear normal.     Mouth/Throat:     Mouth: Mucous membranes are moist.     Pharynx: Oropharynx is clear.  Eyes:     Extraocular Movements: Extraocular movements intact.     Pupils: Pupils are equal, round, and reactive to light.  Cardiovascular:     Rate and Rhythm: Normal rate and regular rhythm.     Pulses: Normal pulses.     Heart sounds: Normal heart sounds.  Pulmonary:     Effort: Pulmonary effort is normal.     Breath sounds: Normal breath sounds.  Abdominal:     General: Bowel sounds are normal. There is no distension.     Palpations: There is no mass.     Tenderness: There is no abdominal tenderness.     Hernia: No hernia is present.  Genitourinary:    Comments: deferred Musculoskeletal:     Right lower leg: No edema.     Left lower leg: No edema.  Lymphadenopathy:  Cervical: No cervical adenopathy.  Skin:    General: Skin is warm.  Neurological:     General: No focal deficit present.     Mental Status: He is alert.     Deep Tendon Reflexes:     Reflex Scores:      Bicep reflexes are 2+ on the right side and 2+ on the left side.      Patellar reflexes are 2+ on the right side and 2+ on the left side.    Comments: Bilateral upper and lower extremity strength 5/5  Psychiatric:        Mood and Affect: Mood normal.        Behavior: Behavior normal.        Thought Content: Thought content normal.        Judgment: Judgment normal.      Results for orders placed or performed in visit on 12/07/23  Lipid panel  Result Value Ref Range   Cholesterol 187 0 - 200 mg/dL   Triglycerides 82.9 0.0 - 149.0 mg/dL   HDL  56.21 >30.86 mg/dL   VLDL 6.8 0.0 - 57.8 mg/dL   LDL Cholesterol 469 (H) 0 - 99 mg/dL   Total CHOL/HDL Ratio 5    NonHDL 147.64   TSH  Result Value Ref Range   TSH 1.40 0.35 - 5.50 uIU/mL  Comprehensive metabolic panel  Result Value Ref Range   Sodium 138 135 - 145 mEq/L   Potassium 4.5 3.5 - 5.1 mEq/L   Chloride 102 96 - 112 mEq/L   CO2 30 19 - 32 mEq/L   Glucose, Bld 98 70 - 99 mg/dL   BUN 13 6 - 23 mg/dL   Creatinine, Ser 6.29 0.40 - 1.50 mg/dL   Total Bilirubin 0.4 0.2 - 1.2 mg/dL   Alkaline Phosphatase 58 39 - 117 U/L   AST 13 0 - 37 U/L   ALT 11 0 - 53 U/L   Total Protein 7.5 6.0 - 8.3 g/dL   Albumin 4.8 3.5 - 5.2 g/dL   GFR 528.41 >32.44 mL/min   Calcium 9.8 8.4 - 10.5 mg/dL  CBC  Result Value Ref Range   WBC 4.2 4.0 - 10.5 K/uL   RBC 4.95 4.22 - 5.81 Mil/uL   Platelets 266.0 150.0 - 400.0 K/uL   Hemoglobin 14.3 13.0 - 17.0 g/dL   HCT 01.0 27.2 - 53.6 %   MCV 86.6 78.0 - 100.0 fl   MCHC 33.5 30.0 - 36.0 g/dL   RDW 64.4 03.4 - 74.2 %  Hemoglobin A1c  Result Value Ref Range   Hgb A1c MFr Bld 5.5 4.6 - 6.5 %      The ASCVD Risk score (Arnett DK, et al., 2019) failed to calculate for the following reasons:   The 2019 ASCVD risk score is only valid for ages 20 to 22    Assessment & Plan:   Problem List Items Addressed This Visit       Other   Preventative health care - Primary   Discussed age-appropriate immunizations and screening exams.  Did review patient's personal, surgical, social, family histories.  Patient is up-to-date on all age-appropriate vaccinations he would like. Instructed to obtain influenza and Tdap vaccines from campus health or health department. Labs pending. Will ensure creatinine has returned to normal following nephrolithiasis resolution.   Patient was given information at discharge about preventative healthcare maintenance with anticipatory guidance           Relevant Orders  Comprehensive metabolic panel (Completed)   CBC  (Completed)   Obesity (BMI 30-39.9)   Discussed healthy eating and increasing weekly exercise. Labs pending.       Relevant Orders   Lipid panel (Completed)   TSH (Completed)   Comprehensive metabolic panel (Completed)   CBC (Completed)   Hemoglobin A1c (Completed)   Elevated LDL cholesterol level   Labs pending. Educated on healthy eating and lifestyle changes.       Relevant Orders   Lipid panel (Completed)   Screening for diabetes mellitus   Labs pending.      Relevant Orders   Hemoglobin A1c (Completed)   Encounter for screening for HIV   Labs pending.       Relevant Orders   HIV antibody (with reflex)   Encounter for hepatitis C screening test for low risk patient   Labs pending.       Relevant Orders   Hepatitis C Antibody    Return in about 1 year (around 12/06/2024) for CPE.    Audria Nine, NP

## 2023-12-07 NOTE — Assessment & Plan Note (Signed)
 Discussed healthy eating and increasing weekly exercise. Labs pending.

## 2023-12-07 NOTE — Patient Instructions (Signed)
 It was a pleasure to see you today.  We will follow up with you once all your labs result.  Please follow up in 1 year for your annual physical or sooner if you have any concerns.

## 2023-12-07 NOTE — Assessment & Plan Note (Addendum)
 Discussed age-appropriate immunizations and screening exams.  Did review patient's personal, surgical, social, family histories.  Patient is up-to-date on all age-appropriate vaccinations he would like. Instructed to obtain influenza and Tdap vaccines from campus health or health department. Labs pending. Will ensure creatinine has returned to normal following nephrolithiasis resolution.   Patient was given information at discharge about preventative healthcare maintenance with anticipatory guidance

## 2023-12-07 NOTE — Progress Notes (Signed)
 Established Patient Office Visit  Subjective   Patient ID: Bradley Lane, male    DOB: 05-17-2001  Age: 23 y.o. MRN: [id number]  Chief Complaint  Patient presents with   Annual Exam    HPI  Terran is here for complete physical and follow up of chronic conditions.  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 He reports doing well academically and reports tolerating the stress well without any issues.   Nephrolithiasis:  He was seen in January in the ED and was found to have a kidney stone. He reports passing the stone and he did follow up with [facility]. He will follow up with them next January. Of note Creatinine 1.54. Unable to see follow up labs from urology.     Immunizations: -Tetanus: Due. will update at health department.  -Influenza: Due. will update at health department.  -Shingles: Not indicated -Pneumonia: Not indicated   Diet: Fair diet. 2-3 meals per day with snacks of chips an candy. Drinks 1 bottle of water a day. Also drinks fruit juice, and occasional soda.  Exercise: 30 min of weight lifting twice a week and recreational basketball on the weekends.   Eye exam: Wears glasses, PRN exam.  Dental exam: Last went in high school.     Colonoscopy: Not indicated Lung Cancer Screening: Not indicated PSA: Not indicated      Review of Systems  Constitutional:  Negative for chills, fever and weight loss.  HENT:  Negative for hearing loss and tinnitus.   Eyes:  Negative for blurred vision.  Respiratory:  Negative for cough and shortness of breath.   Cardiovascular:  Negative for chest pain and palpitations.  Gastrointestinal:  Negative for abdominal pain, nausea and vomiting.       Daily BM  Genitourinary:  Negative for dysuria.  Musculoskeletal:  Negative for joint pain and myalgias.  Skin:  Negative for rash.  Neurological:  Negative for dizziness and headaches.  Endo/Heme/Allergies:  Negative for  environmental allergies.  Psychiatric/Behavioral:  Negative for depression and suicidal ideas.       Objective:     BP 110/74   Pulse 60   Temp 97.9 F (36.6 C) (Oral)   Ht 5' 8.25" (1.734 m)   Wt 95.3 kg   SpO2 99%   BMI 31.70 kg/m  BP Readings from Last 3 Encounters:  12/07/23 110/74  10/11/23 137/80  10/10/23 125/74   Wt Readings from Last 3 Encounters:  12/07/23 95.3 kg  10/10/23 93 kg  10/10/23 93 kg   SpO2 Readings from Last 3 Encounters:  12/07/23 99%  10/11/23 100%  10/10/23 100%      Physical Exam Constitutional:      Appearance: Normal appearance. He is not ill-appearing.  HENT:     Right Ear: Tympanic membrane, ear canal and external ear normal.     Left Ear: Tympanic membrane, ear canal and external ear normal.     Mouth/Throat:     Mouth: Mucous membranes are moist.     Pharynx: Oropharynx is clear.  Eyes:     Extraocular Movements: Extraocular movements intact.     Conjunctiva/sclera: Conjunctivae normal.     Pupils: Pupils are equal, round, and reactive to light.  Cardiovascular:     Rate and Rhythm: Normal rate and regular rhythm.     Pulses: Normal pulses.     Heart sounds: Normal heart sounds. No murmur heard.    No friction rub.  No gallop.  Pulmonary:     Effort: Pulmonary effort is normal.     Breath sounds: Normal breath sounds.  Abdominal:     General: Bowel sounds are normal.     Palpations: Abdomen is soft.  Musculoskeletal:     Cervical back: Neck supple.  Lymphadenopathy:     Cervical: No cervical adenopathy.  Skin:    General: Skin is warm and dry.  Neurological:     General: No focal deficit present.     Mental Status: He is alert and oriented to person, place, and time.     Deep Tendon Reflexes:     Reflex Scores:      Bicep reflexes are 2+ on the right side and 2+ on the left side.      Patellar reflexes are 2+ on the right side and 2+ on the left side.    Comments: Bilateral upper and lower extremity strength 5/5   Psychiatric:        Mood and Affect: Mood normal.        Behavior: Behavior normal.      No results found for any visits on 12/07/23.  Last metabolic panel Lab Results  Component Value Date   GLUCOSE 152 (H) 10/10/2023   NA 136 10/10/2023   K 4.2 10/10/2023   CL 103 10/10/2023   CO2 25 10/10/2023   BUN 14 10/10/2023   CREATININE 1.54 (H) 10/10/2023   GFRNONAA >60 10/10/2023   CALCIUM 9.6 10/10/2023   PROT 7.6 10/10/2023   ALBUMIN 4.5 10/10/2023   BILITOT 0.6 10/10/2023   ALKPHOS 59 10/10/2023   AST 17 10/10/2023   ALT 12 10/10/2023   ANIONGAP 8 10/10/2023      The ASCVD Risk score (Arnett DK, et al., 2019) failed to calculate for the following reasons:   The 2019 ASCVD risk score is only valid for ages 94 to 67    Assessment & Plan:   Problem List Items Addressed This Visit     Preventative health care - Primary   Discussed age-appropriate immunizations and screening exams.  Did review patient's personal, surgical, social, family histories.  Patient is up-to-date on all age-appropriate vaccinations he would like. Instructed to obtain influenza and Tdap vaccines from campus health or health department. Labs pending. Will ensure creatinine has returned to normal following nephrolithiasis resolution.   Patient was given information at discharge about preventative healthcare maintenance with anticipatory guidance           Relevant Orders   Comprehensive metabolic panel   CBC   Obesity (BMI 30-39.9)   Discussed healthy eating and increasing weekly exercise. Labs pending.       Relevant Orders   Lipid panel   TSH   Comprehensive metabolic panel   CBC   Hemoglobin A1c   Elevated LDL cholesterol level   Labs pending. Educated on healthy eating and lifestyle changes.       Relevant Orders   Lipid panel   Screening for diabetes mellitus   Labs pending.      Relevant Orders   Hemoglobin A1c   Encounter for screening for HIV   Labs pending.        Relevant Orders   HIV antibody (with reflex)   Encounter for hepatitis C screening test for low risk patient   Labs pending.       Relevant Orders   Hepatitis C Antibody    Return in about 1 year (around 12/06/2024) for CPE.  Murvin Donning, RN

## 2023-12-07 NOTE — Assessment & Plan Note (Signed)
 Labs pending. Educated on healthy eating and lifestyle changes.

## 2023-12-08 ENCOUNTER — Encounter: Payer: Self-pay | Admitting: Nurse Practitioner

## 2023-12-08 DIAGNOSIS — Z23 Encounter for immunization: Secondary | ICD-10-CM | POA: Diagnosis not present

## 2023-12-08 LAB — HEPATITIS C ANTIBODY: Hepatitis C Ab: NONREACTIVE

## 2023-12-08 LAB — HIV ANTIBODY (ROUTINE TESTING W REFLEX): HIV 1&2 Ab, 4th Generation: NONREACTIVE

## 2024-02-15 ENCOUNTER — Other Ambulatory Visit: Payer: Self-pay | Admitting: Medical Genetics

## 2024-02-16 ENCOUNTER — Other Ambulatory Visit

## 2024-02-16 DIAGNOSIS — Z006 Encounter for examination for normal comparison and control in clinical research program: Secondary | ICD-10-CM

## 2024-02-27 LAB — GENECONNECT MOLECULAR SCREEN

## 2024-02-28 ENCOUNTER — Other Ambulatory Visit: Payer: Self-pay | Admitting: Medical Genetics

## 2024-02-28 ENCOUNTER — Telehealth: Payer: Self-pay | Admitting: Medical Genetics

## 2024-02-28 DIAGNOSIS — Z006 Encounter for examination for normal comparison and control in clinical research program: Secondary | ICD-10-CM

## 2024-02-28 NOTE — Telephone Encounter (Signed)
 [place] [facility]  02/28/2024 3:02 PM  Confirmed I was speaking with Bradley Lane [id number] by using name and DOB. Informed participant the reason for this call is to follow-up on a recent sample the participant provided at one of the [facility] lab locations. Informed participant the test was not able to be completed with this sample and apologized for the inconvenience. Participant was requested to provide a new sample at one of our participating labs at no cost so that participant can continue participation and receive test results. Informed participant they do not need to be fasting and if there are other samples that need to be drawn, they can be done at the same visit. Participant has not had a blood transfusion or blood product in the last 30 days. Participant agreed to provide another sample. Participant was provided the [employer] program website to learn why this may have happened. Participant was thanked for their time and continued support of the above study.   Jordyn Pennstrom, BS [place]  [facility] Clinical Research Specialist II Direct Dial: [phone]  Fax: [phone]

## 2024-03-08 ENCOUNTER — Other Ambulatory Visit

## 2024-03-08 DIAGNOSIS — Z006 Encounter for examination for normal comparison and control in clinical research program: Secondary | ICD-10-CM

## 2024-04-10 LAB — GENECONNECT MOLECULAR SCREEN: Genetic Analysis Overall Interpretation: POSITIVE — AB

## 2024-04-11 ENCOUNTER — Telehealth: Payer: Self-pay | Admitting: Medical Genetics

## 2024-04-11 DIAGNOSIS — E7801 Familial hypercholesterolemia: Secondary | ICD-10-CM

## 2024-04-11 DIAGNOSIS — Z1501 Genetic susceptibility to malignant neoplasm of breast: Secondary | ICD-10-CM

## 2024-04-11 NOTE — Telephone Encounter (Signed)
 [place] [facility] Positive Result Note 04/11/2024 2:14 PM  FIRST ATTEMPT: Confirmed I was speaking with Bradley Lane [id number] by using name and DOB. Informed participant the reason for this call is to provide results for the above study. 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 Genetic counseling was offered and participant declined. All questions were answered, and participant was thanked for their time and support of the above study. Participant was encouraged to contact [facility] if they have any further questions or concerns.

## 2024-12-10 ENCOUNTER — Encounter: Admitting: Nurse Practitioner
